# Patient Record
Sex: Male | Born: 1995 | Race: White | Hispanic: No | Marital: Single | Smoking: Never smoker
Health system: Southern US, Community
[De-identification: ages and names within clinical notes are randomized; demographics above are authoritative.]

## PROBLEM LIST (undated history)

## (undated) DIAGNOSIS — Z5189 Encounter for other specified aftercare: Secondary | ICD-10-CM

## (undated) DIAGNOSIS — F329 Major depressive disorder, single episode, unspecified: Secondary | ICD-10-CM

## (undated) DIAGNOSIS — F419 Anxiety disorder, unspecified: Secondary | ICD-10-CM

## (undated) DIAGNOSIS — R011 Cardiac murmur, unspecified: Secondary | ICD-10-CM

## (undated) DIAGNOSIS — J45909 Unspecified asthma, uncomplicated: Secondary | ICD-10-CM

## (undated) DIAGNOSIS — Q200 Common arterial trunk: Secondary | ICD-10-CM

## (undated) DIAGNOSIS — F32A Depression, unspecified: Secondary | ICD-10-CM

## (undated) HISTORY — PX: CARDIAC SURGERY: SHX584

## (undated) HISTORY — DX: Unspecified asthma, uncomplicated: J45.909

## (undated) HISTORY — DX: Encounter for other specified aftercare: Z51.89

## (undated) HISTORY — DX: Cardiac murmur, unspecified: R01.1

## (undated) HISTORY — PX: CARDIAC VALVE REPLACEMENT: SHX585

---
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
## 2015-06-05 ENCOUNTER — Emergency Department
Admission: EM | Admit: 2015-06-05 | Discharge: 2015-06-05 | Disposition: A | Discharge status: Home / Self Care | Payer: BC Managed Care – PPO | Attending: Emergency Medicine | Admitting: Emergency Medicine

## 2015-06-05 ENCOUNTER — Emergency Department: Payer: BC Managed Care – PPO

## 2015-06-05 DIAGNOSIS — J45909 Unspecified asthma, uncomplicated: Secondary | ICD-10-CM | POA: Diagnosis not present

## 2015-06-05 DIAGNOSIS — R079 Chest pain, unspecified: Secondary | ICD-10-CM | POA: Diagnosis not present

## 2015-06-05 LAB — CBC
HCT: 47.8 % (ref 40.0–52.0)
Hemoglobin: 16.3 g/dL (ref 13.0–18.0)
MCH: 28.6 pg (ref 26.0–34.0)
MCHC: 34.1 g/dL (ref 32.0–36.0)
MCV: 83.8 fL (ref 80.0–100.0)
PLATELETS: 300 10*3/uL (ref 150–440)
RBC: 5.7 MIL/uL (ref 4.40–5.90)
RDW: 13.5 % (ref 11.5–14.5)
WBC: 13 10*3/uL — AB (ref 3.8–10.6)

## 2015-06-05 LAB — BASIC METABOLIC PANEL
Anion gap: 11 (ref 5–15)
BUN: 12 mg/dL (ref 6–20)
CALCIUM: 9.6 mg/dL (ref 8.9–10.3)
CO2: 23 mmol/L (ref 22–32)
CREATININE: 0.92 mg/dL (ref 0.61–1.24)
Chloride: 107 mmol/L (ref 101–111)
GFR calc non Af Amer: >60 mL/min (ref >60)
Glucose, Bld: 96 mg/dL (ref 65–99)
Potassium: 3.4 mmol/L — ABNORMAL LOW (ref 3.5–5.1)
SODIUM: 141 mmol/L (ref 135–145)

## 2015-06-05 LAB — TROPONIN I: Troponin I: <0.03 ng/mL (ref <0.031)

## 2015-06-05 NOTE — ED Notes (Signed)
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

## 2015-06-05 NOTE — ED Provider Notes (Signed)
[facility] Emergency Department Provider Note  Time seen: 7:34 PM  I have reviewed the triage vital signs and the nursing notes.   HISTORY  Chief Complaint Chest Pain and Shortness of Breath    HPI Troy Norman is a 20 y.o. 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 According to the patient around 6:30 PM he developed central chest pain which lasted about 10 minutes, denies diaphoresis, but states he was slightly nauseated and short of breath. States it is not uncommon for him to get chest pain approximately once a week or so, but this chest pain was different, more severe and lasted longer than his typical chest pain. Denies any chest pain at this time. Denies nausea or shortness of breath.He describes his chest pain as moderate, sharp in nature located in the center of his chest.     No past medical history on file.  There are no active problems to display for this patient.   No past surgical history on file.  No current outpatient prescriptions on file.  Allergies Review of patient's allergies indicates no known allergies.  No family history on file.  Social History Social History  Substance Use Topics  . Smoking status: Not on file  . Smokeless tobacco: Not on file  . Alcohol Use: Not on file    Review of Systems Constitutional: Negative for fever. Cardiovascular: Positive for chest pain now resolved. Respiratory: Negative for shortness of breath. Gastrointestinal: Negative for abdominal pain Musculoskeletal: Negative for back pain Neurological: Negative for headache 10-point ROS otherwise negative.  ____________________________________________   PHYSICAL EXAM:  VITAL SIGNS: ED Triage Vitals  Enc Vitals Group     BP 06/05/15 1918 159/84 mmHg     Pulse Rate 06/05/15 1918 105      Resp 06/05/15 1918 18     Temp 06/05/15 1918 97.8 F (36.6 C)     Temp Source 06/05/15 1918 Oral     SpO2 06/05/15 1918 97 %     Weight 06/05/15 1918 240 lb (108.863 kg)     Height 06/05/15 1918  (1.651 m)     Head Cir --      Peak Flow --      Pain Score 06/05/15 1919 0     Pain Loc --      Pain Edu? --      Excl. in GC? --     Constitutional: Alert and oriented. Well appearing and in no distress. Eyes: Normal exam ENT   Head: Normocephalic and atraumatic.   Mouth/Throat: Mucous membranes are moist. Cardiovascular: Normal rate, regular rhythm. Respiratory: Normal respiratory effort without tachypnea nor retractions. Breath sounds are clear and equal bilaterally. No wheezes/rales/rhonchi. Gastrointestinal: Soft and nontender. No distention.  Musculoskeletal: Nontender with normal range of motion in all extremities.  Neurologic:  Normal speech and language. No gross focal neurologic deficits Skin:  Skin is warm, dry and intact.  Psychiatric: Mood and affect are normal. Speech and behavior are normal.   ____________________________________________    EKG  EKG reviewed and interpreted by myself shows sinus tachycardia 107 bpm, wide QRS, normal axis, RSR pattern consistent with right bundle-branch block. Nonspecific ST changes. No ST elevations identified.  ____________________________________________     INITIAL IMPRESSION / ASSESSMENT AND PLAN / ED COURSE  Pertinent labs & imaging results that were available during my care  of the patient were reviewed by me and considered in my medical decision making (see chart for details).  Patient presents to the emergency department with central chest pain should patient states started at 6:30 PM tonight. Denies any chest pain at this time. Denies nausea, shortness of breath. States he did feel tingling sensation in his arms and legs with the chest pain and after the chest pain. Denies any currently. Overall the patient  appears very well this time. We'll check labs, and discussed the patient with his cardiologist Dr. Elizebeth Brooking at [facility].  Labs are within normal limits besides a slight leukocytosis per chest x-ray negative. We'll repeat a troponin at 10:30 PM  I discussed the patient with [facility] cardiology, they agree with the plan to repeat a troponin and negative patient will be discharged home and can follow up with them. Currently awaiting repeat troponin result.  Second troponin is negative. We'll discharge the patient home. He remains chest pain-free.  ____________________________________________   FINAL CLINICAL IMPRESSION(S) / ED DIAGNOSES  Chest pain   Minna Antis, MD 06/05/15 [phone]

## 2015-06-05 NOTE — Discharge Instructions (Signed)
You have been seen in the emergency department today for chest pain. Your workup has shown normal results. As we discussed please follow-up with your primary care physician in the next 1-2 days for recheck. Return to the emergency department for any further chest pain, trouble breathing, or any other symptom personally concerning to yourself. ° °Nonspecific Chest Pain °It is often hard to find the cause of chest pain. There is always a chance that your pain could be related to something serious, such as a heart attack or a blood clot in your lungs. Chest pain can also be caused by conditions that are not life-threatening. If you have chest pain, it is very important to follow up with your doctor. ° °HOME CARE °· If you were prescribed an antibiotic medicine, finish it all even if you start to feel better. °· Avoid any activities that cause chest pain. °· Do not use any tobacco products, including cigarettes, chewing tobacco, or electronic cigarettes. If you need help quitting, ask your doctor. °· Do not drink alcohol. °· Take medicines only as told by your doctor. °· Keep all follow-up visits as told by your doctor. This is important. This includes any further testing if your chest pain does not go away. °· Your doctor may tell you to keep your head raised (elevated) while you sleep. °· Make lifestyle changes as told by your doctor. These may include: °¨ Getting regular exercise. Ask your doctor to suggest some activities that are safe for you. °¨ Eating a heart-healthy diet. Your doctor or a diet specialist (dietitian) can help you to learn healthy eating options. °¨ Maintaining a healthy weight. °¨ Managing diabetes, if necessary. °¨ Reducing stress. °GET HELP IF: °· Your chest pain does not go away, even after treatment. °· You have a rash with blisters on your chest. °· You have a fever. °GET HELP RIGHT AWAY IF: °· Your chest pain is worse. °· You have an increasing cough, or you cough up blood. °· You have  severe belly (abdominal) pain. °· You feel extremely weak. °· You pass out (faint). °· You have chills. °· You have sudden, unexplained chest discomfort. °· You have sudden, unexplained discomfort in your arms, back, neck, or jaw. °· You have shortness of breath at any time. °· You suddenly start to sweat, or your skin gets clammy. °· You feel nauseous. °· You vomit. °· You suddenly feel light-headed or dizzy. °· Your heart begins to beat quickly, or it feels like it is skipping beats. °These symptoms may be an emergency. Do not wait to see if the symptoms will go away. Get medical help right away. Call your local emergency services (911 in the [place] Do not drive yourself to the hospital. °  °This information is not intended to replace advice given to you by your health care provider. Make sure you discuss any questions you have with your health care provider. °  °Document Released: 10/18/2007 Document Revised: 05/22/2014 Document Reviewed: 12/05/2013 °Elsevier Interactive Patient Education ©2016 [employer] ° °

## 2015-07-01 DIAGNOSIS — Q200 Common arterial trunk: Secondary | ICD-10-CM | POA: Insufficient documentation

## 2016-06-07 ENCOUNTER — Emergency Department
Admission: EM | Admit: 2016-06-07 | Discharge: 2016-06-07 | Disposition: A | Discharge status: Home / Self Care | Payer: Commercial Managed Care - PPO | Attending: Emergency Medicine | Admitting: Emergency Medicine

## 2016-06-07 DIAGNOSIS — R45851 Suicidal ideations: Secondary | ICD-10-CM | POA: Diagnosis present

## 2016-06-07 DIAGNOSIS — Z79899 Other long term (current) drug therapy: Secondary | ICD-10-CM | POA: Diagnosis not present

## 2016-06-07 DIAGNOSIS — F331 Major depressive disorder, recurrent, moderate: Secondary | ICD-10-CM | POA: Insufficient documentation

## 2016-06-07 HISTORY — DX: Common arterial trunk: Q20.0

## 2016-06-07 HISTORY — DX: Major depressive disorder, single episode, unspecified: F32.9

## 2016-06-07 HISTORY — DX: Anxiety disorder, unspecified: F41.9

## 2016-06-07 HISTORY — DX: Depression, unspecified: F32.A

## 2016-06-07 LAB — COMPREHENSIVE METABOLIC PANEL
ALBUMIN: 4.7 g/dL (ref 3.5–5.0)
ALK PHOS: 51 U/L (ref 38–126)
ALT: 41 U/L (ref 17–63)
AST: 24 U/L (ref 15–41)
Anion gap: 9 (ref 5–15)
BUN: 19 mg/dL (ref 6–20)
CHLORIDE: 105 mmol/L (ref 101–111)
CO2: 24 mmol/L (ref 22–32)
CREATININE: 1.04 mg/dL (ref 0.61–1.24)
Calcium: 9.7 mg/dL (ref 8.9–10.3)
GFR calc Af Amer: >60 mL/min (ref >60)
GFR calc non Af Amer: >60 mL/min (ref >60)
GLUCOSE: 94 mg/dL (ref 65–99)
Potassium: 3.9 mmol/L (ref 3.5–5.1)
SODIUM: 138 mmol/L (ref 135–145)
Total Bilirubin: 0.8 mg/dL (ref 0.3–1.2)
Total Protein: 8.3 g/dL — ABNORMAL HIGH (ref 6.5–8.1)

## 2016-06-07 LAB — CBC
HEMATOCRIT: 45.4 % (ref 40.0–52.0)
HEMOGLOBIN: 15.7 g/dL (ref 13.0–18.0)
MCH: 28.3 pg (ref 26.0–34.0)
MCHC: 34.5 g/dL (ref 32.0–36.0)
MCV: 82 fL (ref 80.0–100.0)
Platelets: 364 10*3/uL (ref 150–440)
RBC: 5.54 MIL/uL (ref 4.40–5.90)
RDW: 14 % (ref 11.5–14.5)
WBC: 15 10*3/uL — ABNORMAL HIGH (ref 3.8–10.6)

## 2016-06-07 LAB — SALICYLATE LEVEL

## 2016-06-07 LAB — ACETAMINOPHEN LEVEL: Acetaminophen (Tylenol), Serum: <10 ug/mL — ABNORMAL LOW (ref 10–30)

## 2016-06-07 LAB — ETHANOL: Alcohol, Ethyl (B): <5 mg/dL (ref <5)

## 2016-06-07 MED ORDER — HYDROXYZINE PAMOATE 25 MG PO CAPS: 25.0000 mg | ORAL_CAPSULE | Freq: Two times a day (BID) | ORAL | 0 refills | Status: DC | PRN

## 2016-06-07 MED ORDER — BUPROPION HCL ER (SR) 150 MG PO TB12: 150.0000 mg | ORAL_TABLET | ORAL | 1 refills | Status: DC

## 2016-06-07 NOTE — ED Notes (Signed)
Pt. Going home with parent. 

## 2016-06-07 NOTE — ED Provider Notes (Signed)
[facility] Emergency Department Provider Note  ____________________________________________   First MD Initiated Contact with Patient 06/07/16 1949     (approximate)  I have reviewed the triage vital signs and the nursing notes.   HISTORY  Chief Complaint Suicidal    HPI Troy Norman is a 21 y.o. 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  He presents for evaluation of gradually worsening depression and anxiety over months to years which escalated to the point that he very briefly considered "stabbing myself with a knife" today.  He states that the thought was brief and he immediately realized it was wrong, threw the knife on the ground, and sought help from a friend (his ex-girlfriend).  She contacted his mother who insisted he come to the Emergency Department.  He is very clear that he has no ongoing suicidal ideation but he does feel like he needs help coping with "things".  He denies ETOH, drugs, tobacco.  Denies any active medical complaints as listed below.  Has been having trouble sleeping.  Denies hallucinations.  Denies thoughts of hurting others.  Describes symptoms as severe, nothing makes better nor worse.   Past Medical History:  Diagnosis Date  . Anxiety   . Depression   . Truncus arteriosus     There are no active problems to display for this patient.   History reviewed. No pertinent surgical history.  Prior to Admission medications   Medication Sig Start Date End Date Taking? Authorizing Provider  buPROPion (WELLBUTRIN SR) 150 MG 12 hr tablet Take 1 tablet (150 mg total) by mouth every morning. 06/07/16   Loleta Rose, MD  cetirizine (ZYRTEC) 10 MG tablet Take 10 mg by mouth daily as needed for allergies or rhinitis.    Historical Provider, MD  hydrOXYzine (VISTARIL) 25 MG capsule Take 1 capsule (25 mg total) by  mouth 3 times/day as needed-between meals & bedtime (anxiety, insomnia). 06/07/16   Loleta Rose, MD    Allergies Patient has no known allergies.  History reviewed. No pertinent family history.  Social History Social History  Substance Use Topics  . Smoking status: Never Smoker  . Smokeless tobacco: Never Used  . Alcohol use No    Review of Systems Constitutional: No fever/chills Eyes: No visual changes. ENT: No sore throat. Cardiovascular: Denies chest pain. Respiratory: Denies shortness of breath. Gastrointestinal: No abdominal pain.  No nausea, no vomiting.  No diarrhea.  No constipation. Genitourinary: Negative for dysuria. Musculoskeletal: Negative for back pain. Skin: Negative for rash. Neurological: Negative for headaches, focal weakness or numbness. Psychiatric:Worsening depression and anxiety over extended period of time with brief thought of harming himself today 10-point ROS otherwise negative.  ____________________________________________   PHYSICAL EXAM:  VITAL SIGNS: ED Triage Vitals  Enc Vitals Group     BP 06/07/16 1800 (!) 165/82     Pulse Rate 06/07/16 1800 97     Resp 06/07/16 1800 16     Temp 06/07/16 1800 98.7 F (37.1 C)     Temp Source 06/07/16 1800 Oral     SpO2 06/07/16 1800 98 %     Weight 06/07/16 1801 220 lb (99.8 kg)     Height 06/07/16 1801 5\' 5"  (1.651 m)     Head Circumference --      Peak Flow --      Pain Score --      Pain Loc --  Pain Edu? --      Excl. in GC? --     Constitutional: Alert and oriented. Well appearing and in no acute distress. Eyes: Conjunctivae are normal. PERRL. EOMI. Head: Atraumatic. Nose: No congestion/rhinnorhea. Mouth/Throat: Mucous membranes are moist.  Oropharynx non-erythematous. Neck: No stridor.  No meningeal signs.   Cardiovascular: Normal rate, regular rhythm. Good peripheral circulation. Grossly normal heart sounds. Respiratory: Normal respiratory effort.  No retractions. Lungs  CTAB. Gastrointestinal: Soft and nontender. No distention.  Musculoskeletal: No lower extremity tenderness nor edema. No gross deformities of extremities. Neurologic:  Normal speech and language. No gross focal neurologic deficits are appreciated.  Skin:  Skin is warm, dry and intact. No rash noted.   ____________________________________________   LABS (all labs ordered are listed, but only abnormal results are displayed)  Labs Reviewed  COMPREHENSIVE METABOLIC PANEL - Abnormal; Notable for the following:       Result Value   Total Protein 8.3 (*)    All other components within normal limits  ACETAMINOPHEN LEVEL - Abnormal; Notable for the following:    Acetaminophen (Tylenol), Serum <10 (*)    All other components within normal limits  CBC - Abnormal; Notable for the following:    WBC 15.0 (*)    All other components within normal limits  ETHANOL  SALICYLATE LEVEL  URINE DRUG SCREEN, QUALITATIVE ([facility] ONLY)   ____________________________________________  EKG  No EKG ordered ____________________________________________  RADIOLOGY   No results found.  ____________________________________________   PROCEDURES  Procedure(s) performed:   Procedures   Critical Care performed: No ____________________________________________   INITIAL IMPRESSION / ASSESSMENT AND PLAN / ED COURSE  Pertinent labs & imaging results that were available during my care of the patient were reviewed by me and considered in my medical decision making (see chart for details).  I did not feel that the patient represented an immediate danger to himself, and he actually showed good insight and judgment that he needed to seek assistance.  He does not meet IVC nor inpatient criteria.  I will keep him voluntary and consult telepsych for recommendations.  He agrees with this plan.   (Note that documentation was delayed due to multiple ED patients requiring immediate care.)   Discussed by phone  with telepsych and reviewed written report.  She agrees no indication for inpatient treatment.  Wrote Rxs for Wellbutrin and Vistaril as per her recommendations.  TTS provided outpatient resources.  Gave stric return precautions.      ____________________________________________  FINAL CLINICAL IMPRESSION(S) / ED DIAGNOSES  Final diagnoses:  Moderate episode of recurrent major depressive disorder (HCC)     MEDICATIONS GIVEN DURING THIS VISIT:  Medications - No data to display   NEW OUTPATIENT MEDICATIONS STARTED DURING THIS VISIT:  Discharge Medication List as of 06/07/2016 11:33 PM    START taking these medications   Details  buPROPion (WELLBUTRIN SR) 150 MG 12 hr tablet Take 1 tablet (150 mg total) by mouth every morning., Starting Wed 06/07/2016, Print    hydrOXYzine (VISTARIL) 25 MG capsule Take 1 capsule (25 mg total) by mouth 3 times/day as needed-between meals & bedtime (anxiety, insomnia)., Starting Wed 06/07/2016, Print        Discharge Medication List as of 06/07/2016 11:33 PM      Discharge Medication List as of 06/07/2016 11:33 PM       Note:  This document was prepared using Dragon voice recognition software and may include unintentional dictation errors.    Loleta Roseory Pamlea Finder, MD 06/08/16  0156  

## 2016-06-07 NOTE — BH Assessment (Signed)
Per ER MD (Dr. York CeriseForbach) the patient doesn't need to be seen by Behavioral Medicine. [facility].   Per request of ER MD, writer provided the pt. with information and instructions on how to access Outpatient Mental Health  ([facility] and [facility]), as well as the 24/7 Suicide Psychologist, sport and exerciserevention Lifeline.

## 2016-06-07 NOTE — ED Triage Notes (Signed)
Pt reports hx of anxiety and depression. Girlfriend broke up with him today. Pt reports "the thought entered my mind what if I stabbed myself" in reference to the iphone with broken glass he was holding in his hand. Denies attempt or plan at this time. Pt alert and oriented X4, active, cooperative, pt in NAD. RR even and unlabored, color WNL.  \

## 2016-06-07 NOTE — ED Notes (Signed)
SOC machine set up in Patient room.  Pt. Ready.

## 2016-06-07 NOTE — ED Notes (Signed)
Pt. Finished SOC, machine taken out of room.

## 2016-06-07 NOTE — Discharge Instructions (Signed)
You have been seen in the Emergency Department (ED) today for a psychiatric complaint.  You have been evaluated by psychiatry and we believe you are safe to be discharged from the hospital.    Please return to the ED immediately if you have any thoughts of hurting yourself or anyone else, so that we may help you.  Please avoid alcohol and drug use.  Follow up with your doctor and/or therapist as soon as possible regarding today's ED visit.   Please follow up any other recommendations and clinic appointments provided by the psychiatry team that saw you in the Emergency Department.

## 2016-06-07 NOTE — ED Notes (Signed)
Spoke to parents in waiting room.  Informed patient parents that [facility].  Parents gave phone number that they could be called if [phone]

## 2016-06-07 NOTE — ED Notes (Signed)

## 2016-10-25 ENCOUNTER — Encounter: Payer: Self-pay | Admitting: *Deleted

## 2016-10-25 ENCOUNTER — Emergency Department
Admission: EM | Admit: 2016-10-25 | Discharge: 2016-10-26 | Disposition: A | Discharge status: Home / Self Care | Payer: Commercial Managed Care - PPO | Attending: Student in an Organized Health Care Education/Training Program | Admitting: Student in an Organized Health Care Education/Training Program

## 2016-10-25 DIAGNOSIS — R519 Headache, unspecified: Secondary | ICD-10-CM

## 2016-10-25 DIAGNOSIS — H538 Other visual disturbances: Secondary | ICD-10-CM | POA: Diagnosis not present

## 2016-10-25 DIAGNOSIS — R51 Headache: Secondary | ICD-10-CM | POA: Diagnosis not present

## 2016-10-25 MED ORDER — ACETAMINOPHEN 325 MG PO TABS: 650.0000 mg | ORAL_TABLET | Freq: Once | ORAL | Status: DC

## 2016-10-25 MED ORDER — PROCHLORPERAZINE MALEATE 10 MG PO TABS
10.0000 mg | ORAL_TABLET | Freq: Once | ORAL | Status: DC
  Filled 2016-10-25: qty 1

## 2016-10-25 NOTE — ED Triage Notes (Signed)
Pt reports while at work today, he bent over and had blurred vision, subsided.  Sx reoccurred with a headache.  No n/v/d.  No blurred vision now.  Pt alert.  Speech clear.

## 2016-10-25 NOTE — ED Provider Notes (Signed)
[facility] Emergency Department Provider Note    First MD Initiated Contact with Patient 10/25/16 2333     (approximate)  I have reviewed the triage vital signs and the nursing notes.   HISTORY  Chief Complaint Blurred Vision and Headache    HPI Troy Norman is a 21 y.o. male who presents with 2 brief episodes of left eye blurry vision that occurred while at work today. 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 States that he noted today when he was lifting up a heavy soup bowl at work and bending over he stood up and then started having blurred vision in the left eye. Did not have any pain. Symptoms subsided after a few moments. He then started having increased stress at work and had another episode that lasted alert 30 minutes or so. Did not completely lose vision states it is just blurry. This was followed by a mild headache. Not the worse headache of his life. Says it is very mild in fact in his primary concern was stress and the blurry vision. Denies any associated numbness or tingling. No chest pain or shortness of breath. No nausea or vomiting. Denies any neck pain.   Past Medical History:  Diagnosis Date  . Anxiety   . Depression   . Truncus arteriosus    No family history on file. No past surgical history on file. There are no active problems to display for this patient.     Prior to Admission medications   Medication Sig Start Date End Date Taking? Authorizing Provider  buPROPion (WELLBUTRIN SR) 150 MG 12 hr tablet Take 1 tablet (150 mg total) by mouth every morning. 06/07/16   Loleta Rose, MD  cetirizine (ZYRTEC) 10 MG tablet Take 10 mg by mouth daily as needed for allergies or rhinitis.    [provider]  hydrOXYzine (VISTARIL) 25 MG capsule Take 1 capsule (25 mg total) by mouth 3 times/day as needed-between meals & bedtime (anxiety, insomnia). 06/07/16    Loleta Rose, MD    Allergies Patient has no known allergies.    Social History Social History  Substance Use Topics  . Smoking status: Never Smoker  . Smokeless tobacco: Never Used  . Alcohol use No    Review of Systems Patient denies headaches, rhinorrhea, blurry vision, numbness, shortness of breath, chest pain, edema, cough, abdominal pain, nausea, vomiting, diarrhea, dysuria, fevers, rashes or hallucinations unless otherwise stated above in HPI. ____________________________________________   PHYSICAL EXAM:  VITAL SIGNS: Vitals:   10/25/16 2149  BP: (!) 154/81  Pulse: 100  Resp: 18  Temp: 98.6 F (37 C)    Constitutional: Alert and oriented. Well appearing and in no acute distress. Eyes: Conjunctivae are normal.  Head: Atraumatic. Nose: No congestion/rhinnorhea. Mouth/Throat: Mucous membranes are moist.   Neck: No stridor. Painless ROM.  Cardiovascular: Normal rate, regular rhythm. Grossly normal heart sounds.  Good peripheral circulation. Respiratory: Normal respiratory effort.  No retractions. Lungs CTAB. Gastrointestinal: Soft and nontender. No distention. No abdominal bruits. No CVA tenderness. Genitourinary:  Musculoskeletal: No lower extremity tenderness nor edema.  No joint effusions. Neurologic:  CN- intact.  No facial droop, Normal FNF.  Normal heel to shin.  Sensation intact bilaterally. Normal speech and language. No gross focal neurologic deficits are appreciated. No gait instability.  Skin:  Skin is warm, dry and intact. No rash noted. Psychiatric: Mood and affect are normal. Speech and behavior  are normal.  ____________________________________________   LABS (all labs ordered are listed, but only abnormal results are displayed)  No results found for this or any previous visit (from the past 24 hour(s)). ____________________________________________  EKG My review and personal interpretation at Time: 23;56  Indication: blurry vision  Rate:  85  Rhythm: sinus Axis: normal Other: RBBB, no STEMI, unchanged from previous EKG 1/17 ____________________________________________  RADIOLOGY   ____________________________________________   PROCEDURES  Procedure(s) performed:  Procedures    Critical Care performed: no ____________________________________________   INITIAL IMPRESSION / ASSESSMENT AND PLAN / ED COURSE  Pertinent labs & imaging results that were available during my care of the patient were reviewed by me and considered in my medical decision making (see chart for details).  DDX: migraine, tension, dehydration, dysrhythmia, retinal detachment, stress, unlikely ICH or dissiection  Troy Norman is a 21 y.o. who presents to the ED with above complaints.  Patient is AFVSS in ED. Exam as above. Given current presentation have considered the above differential.  Patient is very well appearing and he has no focal neurodeficits. Blurry vision symptoms resolved. Denies any chest pain. Does not have any bruits on exam. This is not clinically consistent with dissection. EKG is unchanged from previous and the patient is 21 years old therefore do not feel this is related to ACS. Possible component of dehydration given increased workload the patient is not tach tachycardic and is tolerating oral hydration. Possible component of GERD associated headache but headache is very mild. Patient does not have any meningismus. Do not feel that CT imaging clinically indicated.   Clinical Course as of Oct 26 509  Thu Oct 26, 2016  0018 Patient is currently rating his headache as a 1. Bedside ultrasound shows normal optic nerve with no edema and no retinal detachment. His visual acuity is normal. The patient has no focal neuro deficits and has no chest pain at this point.  As he is asymptomatic, well-appearing and denies any other complaints that he feels patient is stable for discharge to follow up with his PCP.  [PR]    Clinical Course  User Index [PR] Troy Norman, Victorya Hillman, MD     ____________________________________________   FINAL CLINICAL IMPRESSION(S) / ED DIAGNOSES  Final diagnoses:  Blurry vision, left eye  Acute nonintractable headache, unspecified headache type      NEW MEDICATIONS STARTED DURING THIS VISIT:  New Prescriptions   No medications on file     Note:  This document was prepared using Dragon voice recognition software and may include unintentional dictation errors.    Troy Norman, Troy Massi, MD 10/26/16 [phone]

## 2016-10-26 NOTE — Discharge Instructions (Signed)

## 2019-02-23 ENCOUNTER — Emergency Department
Admission: EM | Admit: 2019-02-23 | Discharge: 2019-02-23 | Disposition: A | Discharge status: Home / Self Care | Payer: BC Managed Care – PPO | Attending: Emergency Medicine | Admitting: Emergency Medicine

## 2019-02-23 ENCOUNTER — Emergency Department: Payer: BC Managed Care – PPO

## 2019-02-23 ENCOUNTER — Other Ambulatory Visit: Payer: Self-pay

## 2019-02-23 ENCOUNTER — Encounter: Payer: Self-pay | Admitting: Emergency Medicine

## 2019-02-23 DIAGNOSIS — R079 Chest pain, unspecified: Secondary | ICD-10-CM

## 2019-02-23 DIAGNOSIS — Z87898 Personal history of other specified conditions: Secondary | ICD-10-CM | POA: Insufficient documentation

## 2019-02-23 DIAGNOSIS — R0789 Other chest pain: Secondary | ICD-10-CM | POA: Insufficient documentation

## 2019-02-23 DIAGNOSIS — Z9889 Other specified postprocedural states: Secondary | ICD-10-CM

## 2019-02-23 LAB — BASIC METABOLIC PANEL
Anion gap: 14 (ref 5–15)
BUN: 17 mg/dL (ref 6–20)
CO2: 23 mmol/L (ref 22–32)
Calcium: 10 mg/dL (ref 8.9–10.3)
Chloride: 102 mmol/L (ref 98–111)
Creatinine, Ser: 0.91 mg/dL (ref 0.61–1.24)
GFR calc Af Amer: >60 mL/min (ref >60)
GFR calc non Af Amer: >60 mL/min (ref >60)
Glucose, Bld: 100 mg/dL — ABNORMAL HIGH (ref 70–99)
Potassium: 3.7 mmol/L (ref 3.5–5.1)
Sodium: 139 mmol/L (ref 135–145)

## 2019-02-23 LAB — CBC
HCT: 47 % (ref 39.0–52.0)
Hemoglobin: 16.5 g/dL (ref 13.0–17.0)
MCH: 28.8 pg (ref 26.0–34.0)
MCHC: 35.1 g/dL (ref 30.0–36.0)
MCV: 82.2 fL (ref 80.0–100.0)
Platelets: 339 10*3/uL (ref 150–400)
RBC: 5.72 MIL/uL (ref 4.22–5.81)
RDW: 12.4 % (ref 11.5–15.5)
WBC: 9.9 10*3/uL (ref 4.0–10.5)
nRBC: 0 % (ref 0.0–0.2)

## 2019-02-23 LAB — TROPONIN I (HIGH SENSITIVITY)
Troponin I (High Sensitivity): 5 ng/L (ref <18)
Troponin I (High Sensitivity): 4 ng/L (ref <18)

## 2019-02-23 NOTE — ED Provider Notes (Signed)
[facility] Emergency Department Provider Note   ____________________________________________   First MD Initiated Contact with Patient 02/23/19 1552     (approximate)  I have reviewed the triage vital signs and the nursing notes.   HISTORY  Chief Complaint Chest Pain    HPI Troy Norman is a 23 y.o. male with history of truncus arteriosus status post repair and anxiety who presents to the ED complaining of chest pain.  Patient reports that he initially developed a squeezing pain in the center of his chest yesterday while he was at work.  He was working in [employer] over a stove when he suddenly felt nauseous and dry heaved.  Pain seemed to improve afterwards, but recurred this morning.  He states he will occasionally deal with pain in the center of his chest which he attributes to anxiety, but this was more severe than usual.  He denies any associated fevers, chills, cough, or shortness of breath.  He has not noticed any pain or swelling in his legs.        Past Medical History:  Diagnosis Date  . Anxiety   . Depression   . Truncus arteriosus     There are no active problems to display for this patient.   Past Surgical History:  Procedure Laterality Date  . CARDIAC SURGERY     x3 last at age 60    Prior to Admission medications   Not on File    Allergies Patient has no known allergies.  No family history on file.  Social History Social History   Tobacco Use  . Smoking status: Never Smoker  . Smokeless tobacco: Never Used  Substance Use Topics  . Alcohol use: No  . Drug use: No    Review of Systems  Constitutional: No fever/chills Eyes: No visual changes. ENT: No sore throat. Cardiovascular: Positive for chest pain. Respiratory: Denies shortness of breath. Gastrointestinal: No abdominal pain.  No nausea, no vomiting.  No diarrhea.  No constipation. Genitourinary: Negative for dysuria. Musculoskeletal: Negative  for back pain. Skin: Negative for rash. Neurological: Negative for headaches, focal weakness or numbness.  ____________________________________________   PHYSICAL EXAM:  VITAL SIGNS: ED Triage Vitals  Enc Vitals Group     BP 02/23/19 1323 (!) 147/75     Pulse Rate 02/23/19 1323 (!) 104     Resp 02/23/19 1323 18     Temp 02/23/19 1323 98.4 F (36.9 C)     Temp Source 02/23/19 1323 Oral     SpO2 02/23/19 1323 100 %     Weight 02/23/19 1324 210 lb (95.3 kg)     Height 02/23/19 1324 5\' 6"  (1.676 m)     Head Circumference --      Peak Flow --      Pain Score 02/23/19 1323 2     Pain Loc --      Pain Edu? --      Excl. in GC? --     Constitutional: Alert and oriented. Eyes: Conjunctivae are normal. Head: Atraumatic. Nose: No congestion/rhinnorhea. Mouth/Throat: Mucous membranes are moist. Neck: Normal ROM Cardiovascular: Normal rate, regular rhythm. Grossly normal heart sounds. Respiratory: Normal respiratory effort.  No retractions. Lungs CTAB. Gastrointestinal: Soft and nontender. No distention. Genitourinary: deferred Musculoskeletal: No lower extremity tenderness nor edema. Neurologic:  Normal speech and language. No gross focal neurologic deficits are appreciated. Skin:  Skin is warm, dry and intact. No rash noted. Psychiatric: Mood and affect are normal. Speech and behavior  are normal.  ____________________________________________   LABS (all labs ordered are listed, but only abnormal results are displayed)  Labs Reviewed  BASIC METABOLIC PANEL - Abnormal; Notable for the following components:      Result Value   Glucose, Bld 100 (*)    All other components within normal limits  CBC  TROPONIN I (HIGH SENSITIVITY)  TROPONIN I (HIGH SENSITIVITY)   ____________________________________________  EKG  ED ECG REPORT I, Blake Divine, the attending physician, personally viewed and interpreted this ECG.   Date: 02/23/2019  EKG Time: 13:19  Rate: 99  Rhythm:  normal sinus rhythm  Axis: Normal  Intervals:right bundle branch block  ST&T Change: None, RBBB unchanged    PROCEDURES  Procedure(s) performed (including Critical Care):  Procedures   ____________________________________________   INITIAL IMPRESSION / ASSESSMENT AND PLAN / ED COURSE       23 year old male with history of truncus arteriosus status post repair presents to the ED complaining of intermittent episodes of chest pain since last night.  These are atypical, not associated with exertion.  He has no risk factors for ACS and EKG is unchanged from prior, shows old right bundle branch block.  2 sets of troponin are negative, doubt ACS.  Chest x-ray negative for acute process and remainder of labs are unremarkable.  At this point, very low suspicion that his symptoms are cardiac in etiology, anxiety appears more likely.  Encouraged patient to follow-up with his cardiologist and return to the ED for new or worsening symptoms, patient agrees with plan.      ____________________________________________   FINAL CLINICAL IMPRESSION(S) / ED DIAGNOSES  Final diagnoses:  Chest pain, unspecified type  S/P repair of truncus arteriosus     ED Discharge Orders    None       Note:  This document was prepared using Dragon voice recognition software and may include unintentional dictation errors.   Blake Divine, MD 02/23/19 [phone]

## 2019-02-23 NOTE — ED Triage Notes (Signed)
Pt arrived via POV with reports of chest pain that started last night around 8pm, pt states he was at work at [employer] and developed a pain in the epigastric region, pt states since then the pain has moved up in his chest more. Pt states the pain comes and goes. Pt states when he gets chest pain he feels like his heart is racing. Non-radiating at this time.  Pt has hx of congenital heart defect, has had 3 open heart surgeries with the last one about 7 years ago.  Pt states he has not followed up with cardiology yet.  Pt also states that he has had increased anxiety as well.

## 2019-02-23 NOTE — ED Notes (Addendum)
Patient states cp since yesterday history of cardiac issues in past, has had open heart surgery. Patient placed on monitor. Md T bedside.

## 2019-02-23 NOTE — ED Triage Notes (Signed)
First Nurse Note:  C/O chest pain x 1 day.  States has history of anxiety.  AAOx3.  Skin warm and dry. NAD

## 2019-03-03 ENCOUNTER — Encounter (HOSPITAL_COMMUNITY): Payer: Self-pay | Admitting: Licensed Clinical Social Worker

## 2019-03-03 ENCOUNTER — Ambulatory Visit (INDEPENDENT_AMBULATORY_CARE_PROVIDER_SITE_OTHER): Payer: BC Managed Care – PPO | Admitting: Licensed Clinical Social Worker

## 2019-03-03 ENCOUNTER — Other Ambulatory Visit: Payer: Self-pay

## 2019-03-03 DIAGNOSIS — F411 Generalized anxiety disorder: Secondary | ICD-10-CM | POA: Diagnosis not present

## 2019-03-03 DIAGNOSIS — F410 Panic disorder [episodic paroxysmal anxiety] without agoraphobia: Secondary | ICD-10-CM | POA: Diagnosis not present

## 2019-03-03 NOTE — Progress Notes (Signed)
Virtual Visit via Video Note  I connected with Troy Norman on 03/03/19 at  8:00 AM EDT by a video enabled telemedicine application and verified that I am speaking with the correct person using two identifiers.    I discussed the limitations of evaluation and management by telemedicine and the availability of in person appointments. The patient expressed understanding and agreed to proceed.    I discussed the assessment and treatment plan with the patient. The patient was provided an opportunity to ask questions and all were answered. The patient agreed with the plan and demonstrated an understanding of the instructions.   The patient was advised to call back or seek an in-person evaluation if the symptoms worsen or if the condition fails to improve as anticipated.  I provided 60 minutes of non-face-to-face time during this encounter.   Troy MelterJessica R Bralynn Donado, LCSW   Comprehensive Clinical Assessment (CCA) Note  03/03/2019 Troy Norman [id number]  Visit Diagnosis:      ICD-10-CM   1. GAD (generalized anxiety disorder)  F41.1   2. Panic attacks  F41.0       CCA Part One  Part One has been completed on paper by the patient.  (See scanned document in Chart Review)  CCA Part Two A  Intake/Chief Complaint:  CCA Intake With Chief Complaint CCA Part Two Date: 03/03/19 CCA Part Two Time: 0810 Chief Complaint/Presenting Problem: a few years ago, series of events happened in 1 month. Since then, on and off anxious. Would go a few weeks with no problems, then a month of anxiety. Now, past 3 months straight, gets worked up, starting to effect work, school, relationships with friends. Patients Currently Reported Symptoms/Problems: Anxiety sxs: gets jittery, causes chest pains, taking a toll on heart- has had 3 open heart surgeries, goes into panic 1-2 times per week, heart beats fast and hard, high blood pressure. Sometimes no trigger, work. Collateral Involvement: has 1 friend,  had a therapist up until COVID. Family is not really close- they do not talk about this kind of stuff. Individual's Strengths: [place] at [employer], keeps everyone's spirits up at work, tries to enjoy things and get others to feel good. Individual's Preferences: writes music for concert band, listens to podcasts, likes to learn something new Individual's Abilities: very smart, musical, likes to learn, cooking for other people Type of Services Patient Feels Are Needed: medication management, Initial Clinical Notes/Concerns: wakes up with panic/shaking daily  Mental Health Symptoms Depression:  Depression: Fatigue  Mania:  Mania: Racing thoughts, Increased Energy  Anxiety:   Anxiety: Difficulty concentrating, Irritability, Restlessness, Worrying, Tension, Sleep, Fatigue(has panic attacks 1-2 times per week)  Psychosis:  Psychosis: N/A  Trauma:  Trauma: Irritability/anger, Detachment from others, Hypervigilance, Emotional numbing, Avoids reminders of event(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, had 3 open heart surgeries)  Obsessions:  Obsessions: N/A  Compulsions:  Compulsions: N/A  Inattention:  Inattention: N/A  Hyperactivity/Impulsivity:  Hyperactivity/Impulsivity: N/A  Oppositional/Defiant Behaviors:  Oppositional/Defiant Behaviors: N/A  Borderline Personality:  Emotional Irregularity: N/A  Other Mood/Personality Symptoms:      Mental Status Exam Appearance and self-care  Stature:  Stature: Average  Weight:  Weight: Average weight  Clothing:  Clothing: Neat/clean  Grooming:  Grooming: Normal  Cosmetic use:  Cosmetic Use: None  Posture/gait:  Posture/Gait: Normal  Motor activity:  Motor Activity: Not Remarkable  Sensorium  Attention:  Attention: Normal  Concentration:  Concentration: Anxiety interferes  Orientation:  Orientation: X5  Recall/memory:  Recall/Memory: Normal  Affect and Mood  Affect:  Affect: Appropriate  Mood:  Mood: Euthymic, Anxious  Relating  Eye  contact:  Eye Contact: Normal  Facial expression:  Facial Expression: Responsive  Attitude toward examiner:  Attitude Toward Examiner: Cooperative  Thought and Language  Speech flow: Speech Flow: Normal  Thought content:  Thought Content: Appropriate to mood and circumstances  Preoccupation:   NA  Hallucinations:   NA  Organization:   logical  Company secretary of Knowledge:  Fund of Knowledge: Average  Intelligence:  Intelligence: Above Average  Abstraction:  Abstraction: Normal  Judgement:  Judgement: Normal  Reality Testing:  Reality Testing: Realistic  Insight:  Insight: Good  Decision Making:  Decision Making: Normal  Social Functioning  Social Maturity:  Social Maturity: Responsible  Social Judgement:  Social Judgement: Normal  Stress  Stressors:  Stressors: Transitions, Money  Coping Ability:  Coping Ability: Building surveyor Deficits:   NA  Supports:   family, friends, work, school   Family and Psychosocial History: Family history Marital status: Single Are you sexually active?: Yes What is your sexual orientation?: heterosexual Has your sexual activity been affected by drugs, alcohol, medication, or emotional stress?: no Does patient have children?: No  Childhood History:  Childhood History By whom was/is the patient raised?: Both parents Additional childhood history information: born in [place], moved to [place] at 6-7. played soccer, gymnastics. Artery closed and had a stint in 5th grade. Started band in middle school. Had 3 open heart surgeries. Description of patient's relationship with caregiver when they were a child: pretty good relationship with parents Patient's description of current relationship with people who raised him/her: still good, lives with parents until he gets out of college How were you disciplined when you got in trouble as a child/adolescent?: yelling, time out Does patient have siblings?: Yes Number of Siblings: 1 Description of patient's  current relationship with siblings: younger sister- gets along alright Did patient suffer any verbal/emotional/physical/sexual abuse as a child?: No Did patient suffer from severe childhood neglect?: No Has patient ever been sexually abused/assaulted/raped as an adolescent or adult?: No Was the patient ever a victim of a crime or a disaster?: No Witnessed domestic violence?: No Has patient been effected by domestic violence as an adult?: No  CCA Part Two B  Employment/Work Situation: Employment / Work Psychologist, occupational Employment situation: Employed Where is patient currently employed?: [employer] in [place]- works about 30 hours over the weekend How long has patient been employed?: 2.5 years Patient's job has been impacted by current illness: Yes Describe how patient's job has been impacted: panic attacks started when started wearing a mask What is the longest time patient has a held a job?: current job Where was the patient employed at that time?: [employer] Did You Receive Any Psychiatric Treatment/Services While in the 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 Are These Weapons Safely Secured?: Yes  Education: Education School Currently Attending: [employer] Last Grade Completed: 12 Name of High School: [employer] in [place] Did You Graduate From [employer]?: Yes Did You Attend College?: Yes What Type of College Degree Do you Have?: almost done with BA in History Did You Attend Graduate School?: No What Was Your Major?: History Did You Have Any Special Interests In School?: wants to be a history teacher Did You Have An Individualized Education Program (IIEP): No Did You Have Any Difficulty At School?: No  Religion: Religion/Spirituality  Are You A Religious Person?: No How Might This Affect Treatment?: it won't  Leisure/Recreation: Leisure / Recreation Leisure and Hobbies: write music, video  games, hangs out with friends  Exercise/Diet: Exercise/Diet Do You Exercise?: No Have You Gained or Lost A Significant Amount of Weight in the Past Six Months?: No Do You Follow a Special Diet?: No Do You Have Any Trouble Sleeping?: Yes Explanation of Sleeping Difficulties: some trouble getting to sleep, has periods where he will only sleep 3-4 hours per night, wakes hourly.  CCA Part Two C  Alcohol/Drug Use: Alcohol / Drug Use Pain Medications: see MAR Prescriptions: see MAR Over the Counter: see MAR History of alcohol / drug use?: No history of alcohol / drug abuse Longest period of sobriety (when/how long): drinks occasionally                      CCA Part Three  ASAM's:  Six Dimensions of Multidimensional Assessment  Dimension 1:  Acute Intoxication and/or Withdrawal Potential:     Dimension 2:  Biomedical Conditions and Complications:     Dimension 3:  Emotional, Behavioral, or Cognitive Conditions and Complications:     Dimension 4:  Readiness to Change:     Dimension 5:  Relapse, Continued use, or Continued Problem Potential:     Dimension 6:  Recovery/Living Environment:      Substance use Disorder (SUD)    Social Function:  Social Functioning Social Maturity: Responsible Social Judgement: Normal  Stress:  Stress Stressors: Transitions, Money Coping Ability: Overwhelmed Patient Takes Medications The Way The Doctor Instructed?: No Priority Risk: Low Acuity  Risk Assessment- Self-Harm Potential: Risk Assessment For Self-Harm Potential Thoughts of Self-Harm: No current thoughts Method: No plan Additional Comments for Self-Harm Potential: one hospitalization a few years ago due to SI.  Risk Assessment -Dangerous to Others Potential: Risk Assessment For Dangerous to Others Potential Method: No Plan Availability of Means: No access or NA Intent: Vague intent or NA Notification Required: No need or identified person  DSM5 Diagnoses: There are no  active problems to display for this patient.   Patient Centered Plan: Patient is on the following Treatment Plan(s):  Anxiety  Recommendations for Services/Supports/Treatments:  OPT, Medication Management  Treatment Plan Summary: OP Treatment Plan Summary: "reducing my anxiety"  Referrals to Alternative Service(s): Referred to Alternative Service(s):   Place:   Date:   Time:    Referred to Alternative Service(s):   Place:   Date:   Time:    Referred to Alternative Service(s):   Place:   Date:   Time:    Referred to Alternative Service(s):   Place:   Date:   Time:     Mindi Curling, LCSW

## 2019-03-20 ENCOUNTER — Encounter (HOSPITAL_COMMUNITY): Payer: Self-pay | Admitting: Licensed Clinical Social Worker

## 2019-03-20 ENCOUNTER — Ambulatory Visit (INDEPENDENT_AMBULATORY_CARE_PROVIDER_SITE_OTHER): Payer: BC Managed Care – PPO | Admitting: Licensed Clinical Social Worker

## 2019-03-20 ENCOUNTER — Other Ambulatory Visit: Payer: Self-pay

## 2019-03-20 DIAGNOSIS — F410 Panic disorder [episodic paroxysmal anxiety] without agoraphobia: Secondary | ICD-10-CM

## 2019-03-20 DIAGNOSIS — F411 Generalized anxiety disorder: Secondary | ICD-10-CM | POA: Diagnosis not present

## 2019-03-20 NOTE — Progress Notes (Signed)
Virtual Visit via Video Note  I connected with Troy Norman on 03/20/19 at 11:00 AM EST by a video enabled telemedicine application and verified that I am speaking with the correct person using two identifiers.     I discussed the limitations of evaluation and management by telemedicine and the availability of in person appointments. The patient expressed understanding and agreed to proceed.  Type of Therapy: Individual Therapy  Treatment Goals addressed: "reducing my anxiety".  Interventions: Motivational Interviewing, CBT, and Other: Grounding and Mindfulness techniques  Summary: Troy Norman is a 23 y.o. male who presents with Generalized Anxiety Disorder and Panic Attacks.   Suicidal/Homicidal: No without intent/plan  Therapist Response:  Jacqualine Mau) met with clinician for individual therapy. Troy Norman discussed his psychiatric symptoms and current life events. Troy Norman shared that he continues to have stress, but he has not had any panic attacks in the past week. Clinician discussed coping skills and provided feedback about deep breathing and thought stopping. Clinician identified current stressors and noted concerns about the future as a significant issue. 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 Clinician identified the importance of taking one step at a time and being able to increase his focus on the here and now.    Plan: Return again in 2-3 weeks.  Diagnosis: Axis I: Generalized Anxiety Disorder and panic attacks.   I discussed the assessment and treatment plan with the patient. The patient was provided an opportunity to ask questions and all were answered. The patient agreed with the plan and demonstrated an understanding of the instructions.   The patient was advised to call back or seek an in-person evaluation if the symptoms worsen or if the condition fails to improve as  anticipated.  I provided 45 minutes of non-face-to-face time during this encounter.   Mindi Curling, LCSW

## 2019-04-16 ENCOUNTER — Ambulatory Visit (INDEPENDENT_AMBULATORY_CARE_PROVIDER_SITE_OTHER): Payer: BC Managed Care – PPO | Admitting: Licensed Clinical Social Worker

## 2019-04-16 ENCOUNTER — Other Ambulatory Visit: Payer: Self-pay

## 2019-04-16 ENCOUNTER — Encounter (HOSPITAL_COMMUNITY): Payer: Self-pay | Admitting: Licensed Clinical Social Worker

## 2019-04-16 DIAGNOSIS — F411 Generalized anxiety disorder: Secondary | ICD-10-CM | POA: Diagnosis not present

## 2019-04-16 DIAGNOSIS — F410 Panic disorder [episodic paroxysmal anxiety] without agoraphobia: Secondary | ICD-10-CM

## 2019-04-16 NOTE — Progress Notes (Signed)
Virtual Visit via Video Note  I connected with Troy Norman on 04/16/19 at 10:00 AM EST by a video enabled telemedicine application and verified that I am speaking with the correct person using two identifiers.     I discussed the limitations of evaluation and management by telemedicine and the availability of in person appointments. The patient expressed understanding and agreed to proceed.  Type of Therapy: Individual Therapy  Treatment Goals addressed: "reducing my anxiety".  Interventions: Motivational Interviewing, CBT, and Other: Grounding and Mindfulness techniques  Summary: Troy Norman is a 23 y.o. male who presents with Generalized Anxiety Disorder and Panic Attacks.   Suicidal/Homicidal: No without intent/plan  Therapist Response:  Jacqualine Mau) met with clinician for individual therapy. Troy Norman discussed his psychiatric symptoms and current life events. Troy Norman shared that he has been concerned about a few of his classes and has some worry that not passing them would cause him to not graduate. Clinician processed these concerns, as well as utilized CBT to identify contingency plans and coping skills. Troy Norman reports he has already processed these plans and understands that if he has to take the class over again next semester, he will be okay. He reports that he has not had any panic attacks or irritability over the past several weeks and feels much more in control of himself and his mood. Clinician discussed options for discharge and noted that at this time another appointment was not needed. Troy Norman agreed to reach out in the future if he feels the need for additional support.    Plan: Return as needed.  Diagnosis: Axis I: Generalized Anxiety Disorder and panic attacks.    I discussed the assessment and treatment plan with the patient. The patient was provided an opportunity to ask questions and all were answered. The patient agreed with the plan and demonstrated an  understanding of the instructions.   The patient was advised to call back or seek an in-person evaluation if the symptoms worsen or if the condition fails to improve as anticipated.  I provided 45 minutes of non-face-to-face time during this encounter.   Mindi Curling, LCSW

## 2019-05-30 ENCOUNTER — Telehealth (INDEPENDENT_AMBULATORY_CARE_PROVIDER_SITE_OTHER): Payer: Self-pay | Admitting: Adult Health

## 2019-05-30 DIAGNOSIS — Z5329 Procedure and treatment not carried out because of patient's decision for other reasons: Secondary | ICD-10-CM

## 2019-05-30 NOTE — Progress Notes (Deleted)
Patient: Troy Norman, Male    DOB: March 22, 1996, 24 y.o.   MRN: [id number] Visit Date: 05/30/2019  Today's Provider: Jairo Ben, FNP   Chief Complaint  Patient presents with  . New Patient (Initial Visit)   Subjective:    New Patient Troy Norman is a 24 y.o. male who presents today for health maintenance and establish patient care. He feels {DESC; WELL/FAIRLY WELL/POORLY:18703}. He reports exercising ***. He reports he is sleeping {DESC; WELL/FAIRLY WELL/POORLY:18703}.  -----------------------------------------------------------------   Review of Systems  All other systems reviewed and are negative.   Social History He  reports that he has never smoked. He has never used smokeless tobacco. He reports that he does not drink alcohol or use drugs. Social History   Socioeconomic History  . Marital status: Single    Spouse name: Not on file  . Number of children: Not on file  . Years of education: Not on file  . Highest education level: Not on file  Occupational History  . Not on file  Tobacco Use  . Smoking status: Never Smoker  . Smokeless tobacco: Never Used  Substance and Sexual Activity  . Alcohol use: No  . Drug use: No  . Sexual activity: Not on file  Other Topics Concern  . Not on file  Social History Narrative  . Not on file   Social Determinants of Health   Financial Resource Strain:   . Difficulty of Paying Living Expenses: Not on file  Food Insecurity:   . Worried About Programme researcher, broadcasting/film/video in the Last Year: Not on file  . Ran Out of Food in the Last Year: Not on file  Transportation Needs:   . Lack of Transportation (Medical): Not on file  . Lack of Transportation (Non-Medical): Not on file  Physical Activity:   . Days of Exercise per Week: Not on file  . Minutes of Exercise per Session: Not on file  Stress:   . Feeling of Stress : Not on file  Social Connections:   . Frequency of Communication with Friends and  Family: Not on file  . Frequency of Social Gatherings with Friends and Family: Not on file  . Attends Religious Services: Not on file  . Active Member of Clubs or Organizations: Not on file  . Attends Banker Meetings: Not on file  . Marital Status: Not on file    There are no problems to display for this patient.   Past Surgical History:  Procedure Laterality Date  . CARDIAC SURGERY     x3 last at age 12    Family History  No family status information on file.   His family history is not on file.     No Known Allergies  Previous Medications   No medications on file    Patient Care Team: Patient, No Pcp Per as PCP - General (General Practice)      Objective:   Vitals: There were no vitals taken for this visit.   Physical Exam   Depression Screen No flowsheet data found.    Assessment & Plan:     Routine Health Maintenance and Physical Exam  Exercise Activities and Dietary recommendations Goals   None      There is no immunization history on file for this patient.  Health Maintenance  Topic Date Due  . HIV Screening  04/18/2011  . TETANUS/TDAP  04/18/2015  . INFLUENZA VACCINE  12/14/2018     Discussed  health benefits of physical activity, and encouraged him to engage in regular exercise appropriate for his age and condition.    --------------------------------------------------------------------

## 2019-06-01 NOTE — Progress Notes (Signed)
No show - not seen

## 2019-06-18 ENCOUNTER — Encounter: Payer: Self-pay | Admitting: Adult Health

## 2019-06-18 ENCOUNTER — Other Ambulatory Visit: Payer: Self-pay

## 2019-06-18 ENCOUNTER — Ambulatory Visit (INDEPENDENT_AMBULATORY_CARE_PROVIDER_SITE_OTHER): Payer: Self-pay | Admitting: Adult Health

## 2019-06-18 VITALS — BP 142/80 | HR 64 | Temp 97.3°F | Resp 16 | Ht 66.0 in | Wt 214.6 lb

## 2019-06-18 DIAGNOSIS — Q200 Common arterial trunk: Secondary | ICD-10-CM

## 2019-06-18 DIAGNOSIS — Z7689 Persons encountering health services in other specified circumstances: Secondary | ICD-10-CM | POA: Insufficient documentation

## 2019-06-18 DIAGNOSIS — Z1329 Encounter for screening for other suspected endocrine disorder: Secondary | ICD-10-CM

## 2019-06-18 DIAGNOSIS — Z13220 Encounter for screening for lipoid disorders: Secondary | ICD-10-CM

## 2019-06-18 DIAGNOSIS — Z0000 Encounter for general adult medical examination without abnormal findings: Secondary | ICD-10-CM

## 2019-06-18 DIAGNOSIS — Z9889 Other specified postprocedural states: Secondary | ICD-10-CM

## 2019-06-18 DIAGNOSIS — Z23 Encounter for immunization: Secondary | ICD-10-CM

## 2019-06-18 NOTE — Progress Notes (Addendum)
Patient: Troy Norman, Male    DOB: January 25, 1996, 24 y.o.   MRN: [id number] Visit Date: 06/18/2019  Today's Provider: Jairo Ben, FNP   Chief Complaint  Patient presents with  . New Patient (Initial Visit)   Subjective:    New Patient Troy Norman is a 24 y.o. male who presents today for health maintenance and establish care. He feels fairly well. He reports he is not exercising . He reports he is sleeping poorly, patient reports difficulty falling and staying asleep.   Truncus arteriosis - Cardiac congenital. Dr. Jefferey Pica at [facility]. He reports he sees him every two years. He has had three open heart surgeries. Last at age 20. He denies any new symptoms since last seeing cardiology. He reports he is up to date on all immunizations, he is unsure with tetanus.   Denies any suicidal or homicidal ideations. Depression medications made him worse so he stopped. He use to do therapy for anxiety but not anymore.  Denies any daily medication. 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  Denies any issues with lifting. Denies back or abdominal pain.   Patient  denies any fever, body aches,chills, rash, chest pain, shortness of breath, nausea, vomiting, or diarrhea.   -----------------------------------------------------------------   Review of Systems  Constitutional: Negative.   HENT: Negative.   Respiratory: Negative.   Cardiovascular: Negative.        History of heart surgery- see surgical history.   Gastrointestinal: Negative.   Musculoskeletal: Negative.   Skin: Negative.   Neurological: Negative.   Hematological: Negative.   Psychiatric/Behavioral: Negative.     Social History He  reports that he has never smoked. He has never used smokeless tobacco. He reports that he does not drink alcohol or use drugs.   Patient Active Problem List   Diagnosis Date Noted  . Routine health maintenance 06/18/2019  . Need  for lipid screening 06/18/2019  . Truncus arteriosus 07/01/2015  . Asthma 07/17/2011  . Chronic rhinitis 07/17/2011  . 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 Gastroesophageal reflux 07/17/2011  . S/P repair of truncus arteriosus 07/17/2011  . Weight gain 07/17/2011  ;  Patient Active Problem List   Diagnosis Date Noted  . Routine health maintenance 06/18/2019  . Need for lipid screening 06/18/2019  . Truncus arteriosus 07/01/2015  . Asthma 07/17/2011  . Chronic rhinitis 07/17/2011  . 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 Gastroesophageal reflux 07/17/2011  . S/P repair of truncus arteriosus 07/17/2011  . Weight gain 07/17/2011    Past Surgical History:  Procedure Laterality Date  . CARDIAC SURGERY     x3 last at age 52    Family History  No family status information on file.   His family history is not on file.     No Known Allergies  Previous Medications   No medications on file    Patient Care Team: Patient, No Pcp Per as PCP - General (General Practice)      Objective:  Blood pressure (!) 142/80, pulse 64, temperature (!) 97.3 F (36.3 C), temperature source Temporal, resp. rate 16, height 5\' 6"  (1.676 m), weight 214 lb 9.6 oz (97.3 kg), SpO2 98 %.  Physical Exam Constitutional:      Appearance: Normal appearance.  HENT:     Head: Normocephalic and atraumatic.     Right Ear: Tympanic membrane, ear canal and external ear normal.     Left Ear: Tympanic membrane, ear canal  and external ear normal. There is no impacted cerumen.     Nose: Nose normal.     Mouth/Throat:     Mouth: Mucous membranes are moist.     Pharynx: No oropharyngeal exudate or posterior oropharyngeal erythema.  Eyes:     General: No scleral icterus.       Right eye: No discharge.        Left eye: No discharge.     Extraocular Movements: Extraocular movements intact.     Conjunctiva/sclera: Conjunctivae normal.     Pupils: Pupils are equal, round, and reactive to light.  Cardiovascular:      Rate and Rhythm: Normal rate and regular rhythm.     Pulses: Normal pulses.     Heart sounds: Murmur present. No friction rub. No gallop.   Musculoskeletal:        General: Normal range of motion.     Cervical back: Normal range of motion and neck supple.  Skin:    General: Skin is warm and dry.     Capillary Refill: Capillary refill takes less than 2 seconds.  Neurological:     General: No focal deficit present.     Mental Status: He is alert and oriented to person, place, and time.     Cranial Nerves: No cranial nerve deficit.     Sensory: No sensory deficit.     Coordination: Coordination normal.     Gait: Gait normal.     Deep Tendon Reflexes: Reflexes normal.      Depression Screen Depression screen PHQ 2/9 06/18/2019  Decreased Interest 1  Down, Depressed, Hopeless 1  PHQ - 2 Score 2  Altered sleeping 1  Tired, decreased energy 1  Change in appetite 1  Feeling bad or failure about yourself  0  Trouble concentrating 0  Moving slowly or fidgety/restless 0  Suicidal thoughts 0  PHQ-9 Score 5  Difficult doing work/chores Somewhat difficult      Assessment & Plan:     Routine Health Maintenance and Physical Exam  Exercise Activities and Dietary recommendations Goals   None     There is no immunization history for the selected administration types on file for this patient.  Health Maintenance  Topic Date Due  . HIV Screening  04/18/2011  . TETANUS/TDAP  04/18/2015  . INFLUENZA VACCINE  12/14/2018     Discussed health benefits of physical activity, and encouraged him to engage in regular exercise appropriate for his age and condition.   yearly dental and vision exams recommended.   Routine health maintenance - Plan: CBC with Differential/Platelet, Comprehensive Metabolic Panel (CMET), TSH, Lipid Panel w/o Chol/HDL Ratio, Visual acuity screening  Need for lipid screening - Plan: CBC with Differential/Platelet, Comprehensive Metabolic Panel (CMET),  TSH, Lipid Panel w/o Chol/HDL Ratio  Screening for thyroid disorder - Plan: CBC with Differential/Platelet, Comprehensive Metabolic Panel (CMET), TSH, Lipid Panel w/o Chol/HDL Ratio  History of cardiovascular surgery  Truncus arteriosus- history  fasting labs.  Keep regular follow ups with cardiology and as needed/ advised. . 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   Return in about 6 months (around 12/16/2019), or if symptoms worsen or fail to improve, for at any time for any worsening symptoms, Go to Emergency room/ urgent care if worse.  Advised patient call the office or your primary care doctor for an appointment if no improvement within 72 hours or if any symptoms change or  worsen at any time  Advised ER or urgent Care if after hours or on weekend. Call 911 for emergency symptoms at any time.Patinet verbalized understanding of all instructions given/reviewed and treatment plan and has no further questions or concerns at this time.    Patient verbalized understanding of all instructions given and denies any further questions at this time.   --------------------------------------------------------------------

## 2019-06-18 NOTE — Patient Instructions (Signed)
Testicular Self-Exam A self-exam of your testicles (testicular self-exam) is looking at and feeling your testicles for unusual lumps or swelling. Swelling, lumps, or pain can be caused by:  Injuries.  Puffiness, redness, and soreness (inflammation).  Infection.  Extra fluids around your testicle (hydrocele).  Twisted testicles (testicular torsion).  Cancer of the testicle (testicular cancer). Why is it important to do a self-exam of testicles? You may need to do self-exams if you are at risk for cancer of the testicles. You may be at risk if you have:  A testicle that has not descended (cryptorchidism).  A history of cancer of the testicle.  A family history of cancer of the testicle. How to do a self-exam of testicles It is easiest to do a self-exam after a warm bath or shower. Testicles are harder to examine when you are cold. A normal testicle is egg-shaped and feels firm. It is smooth, and it is not tender. At the back of your testicles, there is a firm cord that feels like spaghetti (spermatic cord). Look and feel for changes  Stand and hold your penis away from your body.  Look at each testicle to check for lumps or swelling.  Roll each testicle between your thumb and finger. Feel the whole testicle. Feel for: ? Lumps. ? Swelling. ? Discomfort.  Check for swelling or tender bumps in the groin area. Your groin is where your lower belly (abdomen) meets your upper thighs. Contact a health care provider if:  You find a bump or lump. This may be like a small, hard bump that is the size of a pea.  You find swelling.  You find pain.  You find soreness.  You see or feel any other changes. Summary  A self-exam of your testicles is looking at and feeling your testicles for lumps or swelling.  You may need to do self-exams if you are at risk for cancer of the testicle.  You should check each of your testicles for lumps, swelling, or discomfort.  You should check for  swelling or tender bumps in the groin area. Your groin is where your lower belly (abdomen) meets your upper thighs. This information is not intended to replace advice given to you by your health care provider. Make sure you discuss any questions you have with your health care provider. Document Revised: 08/22/2018 Document Reviewed: 03/27/2016 Elsevier Patient Education  2020 [place] Maintenance, Male Adopting a healthy lifestyle and getting preventive care are important in promoting health and wellness. Ask your health care provider about:  The right schedule for you to have regular tests and exams.  Things you can do on your own to prevent diseases and keep yourself healthy. What should I know about diet, weight, and exercise? Eat a healthy diet   Eat a diet that includes plenty of vegetables, fruits, low-fat dairy products, and lean protein.  Do not eat a lot of foods that are high in solid fats, added sugars, or sodium. Maintain a healthy weight Body mass index (BMI) is a measurement that can be used to identify possible weight problems. It estimates body fat based on height and weight. Your health care provider can help determine your BMI and help you achieve or maintain a healthy weight. Get regular exercise Get regular exercise. This is one of the most important things you can do for your health. Most adults should:  Exercise for at least 150 minutes each week. The exercise should increase your heart rate and make  you sweat (moderate-intensity exercise).  Do strengthening exercises at least twice a week. This is in addition to the moderate-intensity exercise.  Spend less time sitting. Even light physical activity can be beneficial. Watch cholesterol and blood lipids Have your blood tested for lipids and cholesterol at 24 years of age, then have this test every 5 years. You may need to have your cholesterol levels checked more often if:  Your lipid or cholesterol  levels are high.  You are older than 24 years of age.  You are at high risk for heart disease. What should I know about cancer screening? Many types of cancers can be detected early and may often be prevented. Depending on your health history and family history, you may need to have cancer screening at various ages. This may include screening for:  Colorectal cancer.  Prostate cancer.  Skin cancer.  Lung cancer. What should I know about heart disease, diabetes, and high blood pressure? Blood pressure and heart disease  High blood pressure causes heart disease and increases the risk of stroke. This is more likely to develop in people who have high blood pressure readings, are of African descent, or are overweight.  Talk with your health care provider about your target blood pressure readings.  Have your blood pressure checked: ? Every 3-5 years if you are 20-28 years of age. ? Every year if you are 24 years old or older.  If you are between the ages of 32 and 78 and are a current or former smoker, ask your health care provider if you should have a one-time screening for abdominal aortic aneurysm (AAA). Diabetes Have regular diabetes screenings. This checks your fasting blood sugar level. Have the screening done:  Once every three years after age 11 if you are at a normal weight and have a low risk for diabetes.  More often and at a younger age if you are overweight or have a high risk for diabetes. What should I know about preventing infection? Hepatitis B If you have a higher risk for hepatitis B, you should be screened for this virus. Talk with your health care provider to find out if you are at risk for hepatitis B infection. Hepatitis C Blood testing is recommended for:  Everyone born from [birth year] through 1965.  Anyone with known risk factors for hepatitis C. Sexually transmitted infections (STIs)  You should be screened each year for STIs, including gonorrhea and  chlamydia, if: ? You are sexually active and are younger than 24 years of age. ? You are older than 24 years of age and your health care provider tells you that you are at risk for this type of infection. ? Your sexual activity has changed since you were last screened, and you are at increased risk for chlamydia or gonorrhea. Ask your health care provider if you are at risk.  Ask your health care provider about whether you are at high risk for HIV. Your health care provider may recommend a prescription medicine to help prevent HIV infection. If you choose to take medicine to prevent HIV, you should first get tested for HIV. You should then be tested every 3 months for as long as you are taking the medicine. Follow these instructions at home: Lifestyle  Do not use any products that contain nicotine or tobacco, such as cigarettes, e-cigarettes, and chewing tobacco. If you need help quitting, ask your health care provider.  Do not use street drugs.  Do not share needles.  Ask  your health care provider for help if you need support or information about quitting drugs. Alcohol use  Do not drink alcohol if your health care provider tells you not to drink.  If you drink alcohol: ? Limit how much you have to 0-2 drinks a day. ? Be aware of how much alcohol is in your drink. In the [place] one drink equals one 12 oz bottle of beer (355 mL), one 5 oz glass of wine (148 mL), or one 1 oz glass of hard liquor (44 mL). General instructions  Schedule regular health, dental, and eye exams.  Stay current with your vaccines.  Tell your health care provider if: ? You often feel depressed. ? You have ever been abused or do not feel safe at home. Summary  Adopting a healthy lifestyle and getting preventive care are important in promoting health and wellness.  Follow your health care provider's instructions about healthy diet, exercising, and getting tested or screened for diseases.  Follow your health  care provider's instructions on monitoring your cholesterol and blood pressure. This information is not intended to replace advice given to you by your health care provider. Make sure you discuss any questions you have with your health care provider. Document Revised: 04/24/2018 Document Reviewed: 04/24/2018 Elsevier Patient Education  2020 [employer].

## 2019-07-01 ENCOUNTER — Telehealth: Payer: Self-pay

## 2019-07-01 NOTE — Progress Notes (Signed)
Patient has been advised. KW 

## 2019-07-01 NOTE — Telephone Encounter (Signed)
Spoke with patient and advised him to contact cardiologist office to have portion of cardiac to be filled out on form. KW

## 2019-07-01 NOTE — Telephone Encounter (Signed)
Copied from CRM [id number]. Topic: General - Other >> Jul 01, 2019  3:03 PM Dalphine Handing A wrote: Patient stated that he is returning [place] call and would like a callback. Please advise

## 2019-07-14 NOTE — Telephone Encounter (Signed)
Pt would like to know if the paperwork can be faxed to his Cardiologist Dr. Daryl Eastern so they may fill out their portion. Please advise.  FAX# [phone]

## 2019-07-14 NOTE — Telephone Encounter (Signed)
I tried clarifying with patient about discussion on 07/01/19 and patient states that he did not understand at time of conversation that he was suppose to take form to his cardiologist to have cardiac portion filled out. Patient asked if we had form and can send to cardiologist? I didn't see form scanned,can you review chart. KW

## 2019-07-14 NOTE — Telephone Encounter (Signed)
Did he receive the form that I filled out ?

## 2019-07-15 NOTE — Telephone Encounter (Signed)
He said yesterday he didn't have it and that he can request another form and have it faxed to our office to fill out and then we can send to his cardiologist to fill out. KW

## 2019-07-15 NOTE — Telephone Encounter (Signed)
Ok yes this will be the best, I remember filling out the form and writing defer to cardiology given his extensive cardiology history on the cardiac section only.

## 2019-07-15 NOTE — Telephone Encounter (Signed)
[id number] 

## 2019-07-16 ENCOUNTER — Other Ambulatory Visit: Payer: Self-pay | Admitting: Adult Health

## 2019-07-16 DIAGNOSIS — Z111 Encounter for screening for respiratory tuberculosis: Secondary | ICD-10-CM

## 2019-07-16 NOTE — Telephone Encounter (Signed)
Form has been filled and faxed to cardiology, patient was notified and told to have labs drawn. KW

## 2019-07-16 NOTE — Addendum Note (Signed)
Addended by: Fonda Kinder on: 07/16/2019 02:32 PM   Modules accepted: Orders

## 2019-07-17 ENCOUNTER — Other Ambulatory Visit: Payer: Self-pay | Admitting: Adult Health

## 2019-07-19 LAB — COMPREHENSIVE METABOLIC PANEL
ALT: 21 IU/L (ref 0–44)
AST: 18 IU/L (ref 0–40)
Albumin/Globulin Ratio: 1.7 (ref 1.2–2.2)
Albumin: 4.8 g/dL (ref 4.1–5.2)
Alkaline Phosphatase: 47 IU/L (ref 39–117)
BUN/Creatinine Ratio: 16 (ref 9–20)
BUN: 15 mg/dL (ref 6–20)
Bilirubin Total: 0.7 mg/dL (ref 0.0–1.2)
CO2: 24 mmol/L (ref 20–29)
Calcium: 9.7 mg/dL (ref 8.7–10.2)
Chloride: 99 mmol/L (ref 96–106)
Creatinine, Ser: 0.91 mg/dL (ref 0.76–1.27)
GFR calc Af Amer: 137 mL/min/{1.73_m2} (ref >59)
GFR calc non Af Amer: 118 mL/min/{1.73_m2} (ref >59)
Globulin, Total: 2.8 g/dL (ref 1.5–4.5)
Glucose: 67 mg/dL (ref 65–99)
Potassium: 4.2 mmol/L (ref 3.5–5.2)
Sodium: 138 mmol/L (ref 134–144)
Total Protein: 7.6 g/dL (ref 6.0–8.5)

## 2019-07-19 LAB — QUANTIFERON-TB GOLD PLUS
QuantiFERON Mitogen Value: >10 IU/mL
QuantiFERON Nil Value: 0.04 IU/mL
QuantiFERON TB1 Ag Value: 0.1 IU/mL
QuantiFERON TB2 Ag Value: 0.08 IU/mL
QuantiFERON-TB Gold Plus: NEGATIVE

## 2019-07-19 LAB — CBC WITH DIFFERENTIAL/PLATELET
Basophils Absolute: 0 10*3/uL (ref 0.0–0.2)
Basos: 1 %
EOS (ABSOLUTE): 0.2 10*3/uL (ref 0.0–0.4)
Eos: 2 %
Hematocrit: 46.3 % (ref 37.5–51.0)
Hemoglobin: 15.7 g/dL (ref 13.0–17.7)
Immature Grans (Abs): 0 10*3/uL (ref 0.0–0.1)
Immature Granulocytes: 0 %
Lymphocytes Absolute: 1.8 10*3/uL (ref 0.7–3.1)
Lymphs: 20 %
MCH: 28.7 pg (ref 26.6–33.0)
MCHC: 33.9 g/dL (ref 31.5–35.7)
MCV: 85 fL (ref 79–97)
Monocytes Absolute: 0.7 10*3/uL (ref 0.1–0.9)
Monocytes: 8 %
Neutrophils Absolute: 6.1 10*3/uL (ref 1.4–7.0)
Neutrophils: 69 %
Platelets: 310 10*3/uL (ref 150–450)
RBC: 5.47 x10E6/uL (ref 4.14–5.80)
RDW: 12.8 % (ref 11.6–15.4)
WBC: 8.8 10*3/uL (ref 3.4–10.8)

## 2019-07-19 LAB — TSH: TSH: 1.37 u[IU]/mL (ref 0.450–4.500)

## 2019-07-19 LAB — LIPID PANEL W/O CHOL/HDL RATIO
Cholesterol, Total: 194 mg/dL (ref 100–199)
HDL: 47 mg/dL (ref >39)
LDL Chol Calc (NIH): 125 mg/dL — ABNORMAL HIGH (ref 0–99)
Triglycerides: 123 mg/dL (ref 0–149)
VLDL Cholesterol Cal: 22 mg/dL (ref 5–40)

## 2019-07-20 NOTE — Progress Notes (Signed)
CBC no anemia or infection. CMP glucose 100, ok but should increase tolerable exercise and monitor glucose and carbohydrates in diet. Liver, kidney and electrolytes are within normal limits. TSH f- thyroid lab is within normal limits.  LDL bad cholesterol is elevated. LDL elevated.  Discuss lifestyle modification with patient e.g. increase exercise, fiber, fruits, vegetables, lean meat, and omega 3/fish intake and decrease saturated fat.   Quantiferon TB Gold is negative. 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

## 2019-08-27 ENCOUNTER — Ambulatory Visit (HOSPITAL_COMMUNITY): Payer: BC Managed Care – PPO | Admitting: Licensed Clinical Social Worker

## 2020-08-10 IMAGING — CR DG CHEST 2V
1 series · 2 of 2 positions shown · non-contrast
Comparison: None.

CLINICAL DATA: Chest pain. Congenital heart defect status post open
heart surgery. Former smoker.

EXAM:
CHEST - 2 VIEW

[Series 1: dg chest 2 view · 0.14mm/px · 2 of 2 slices shown]
[im 1/2]
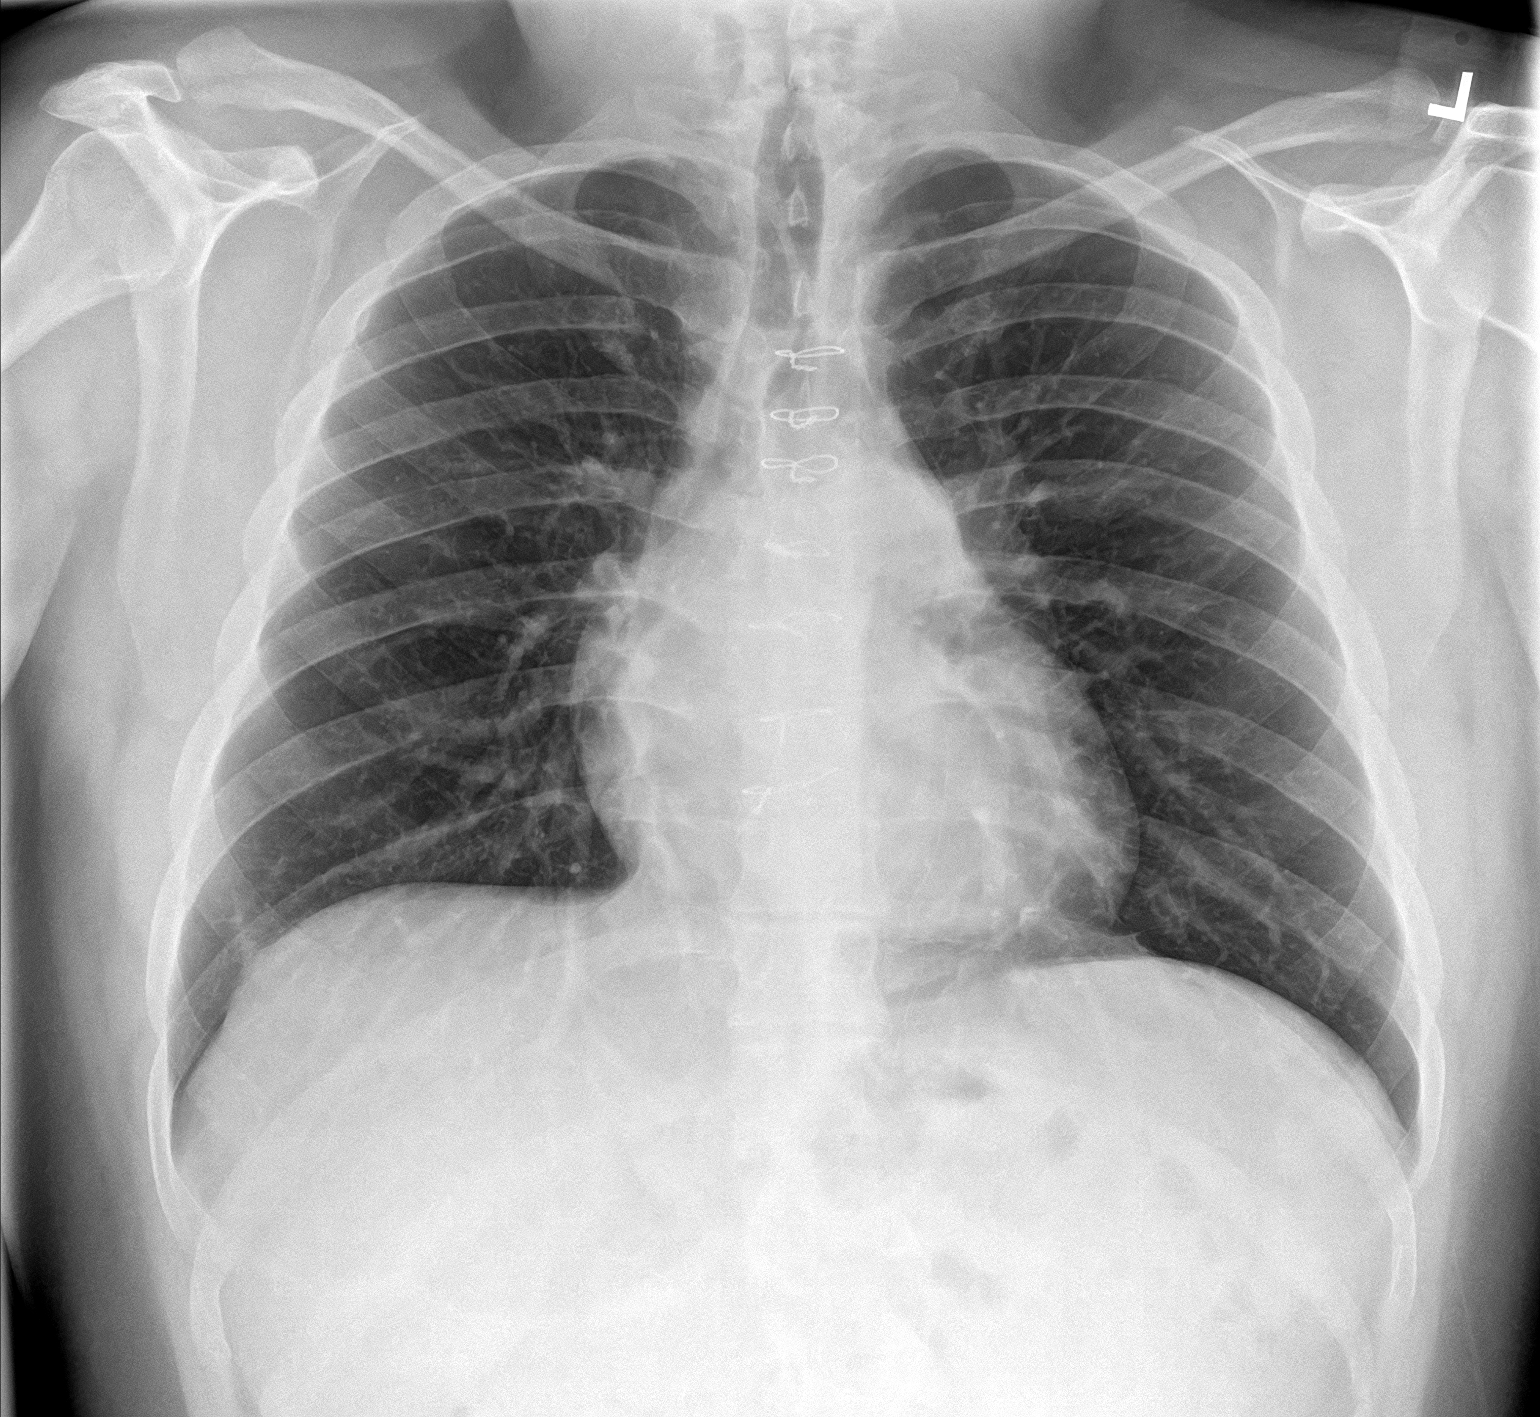
[im 2/2]
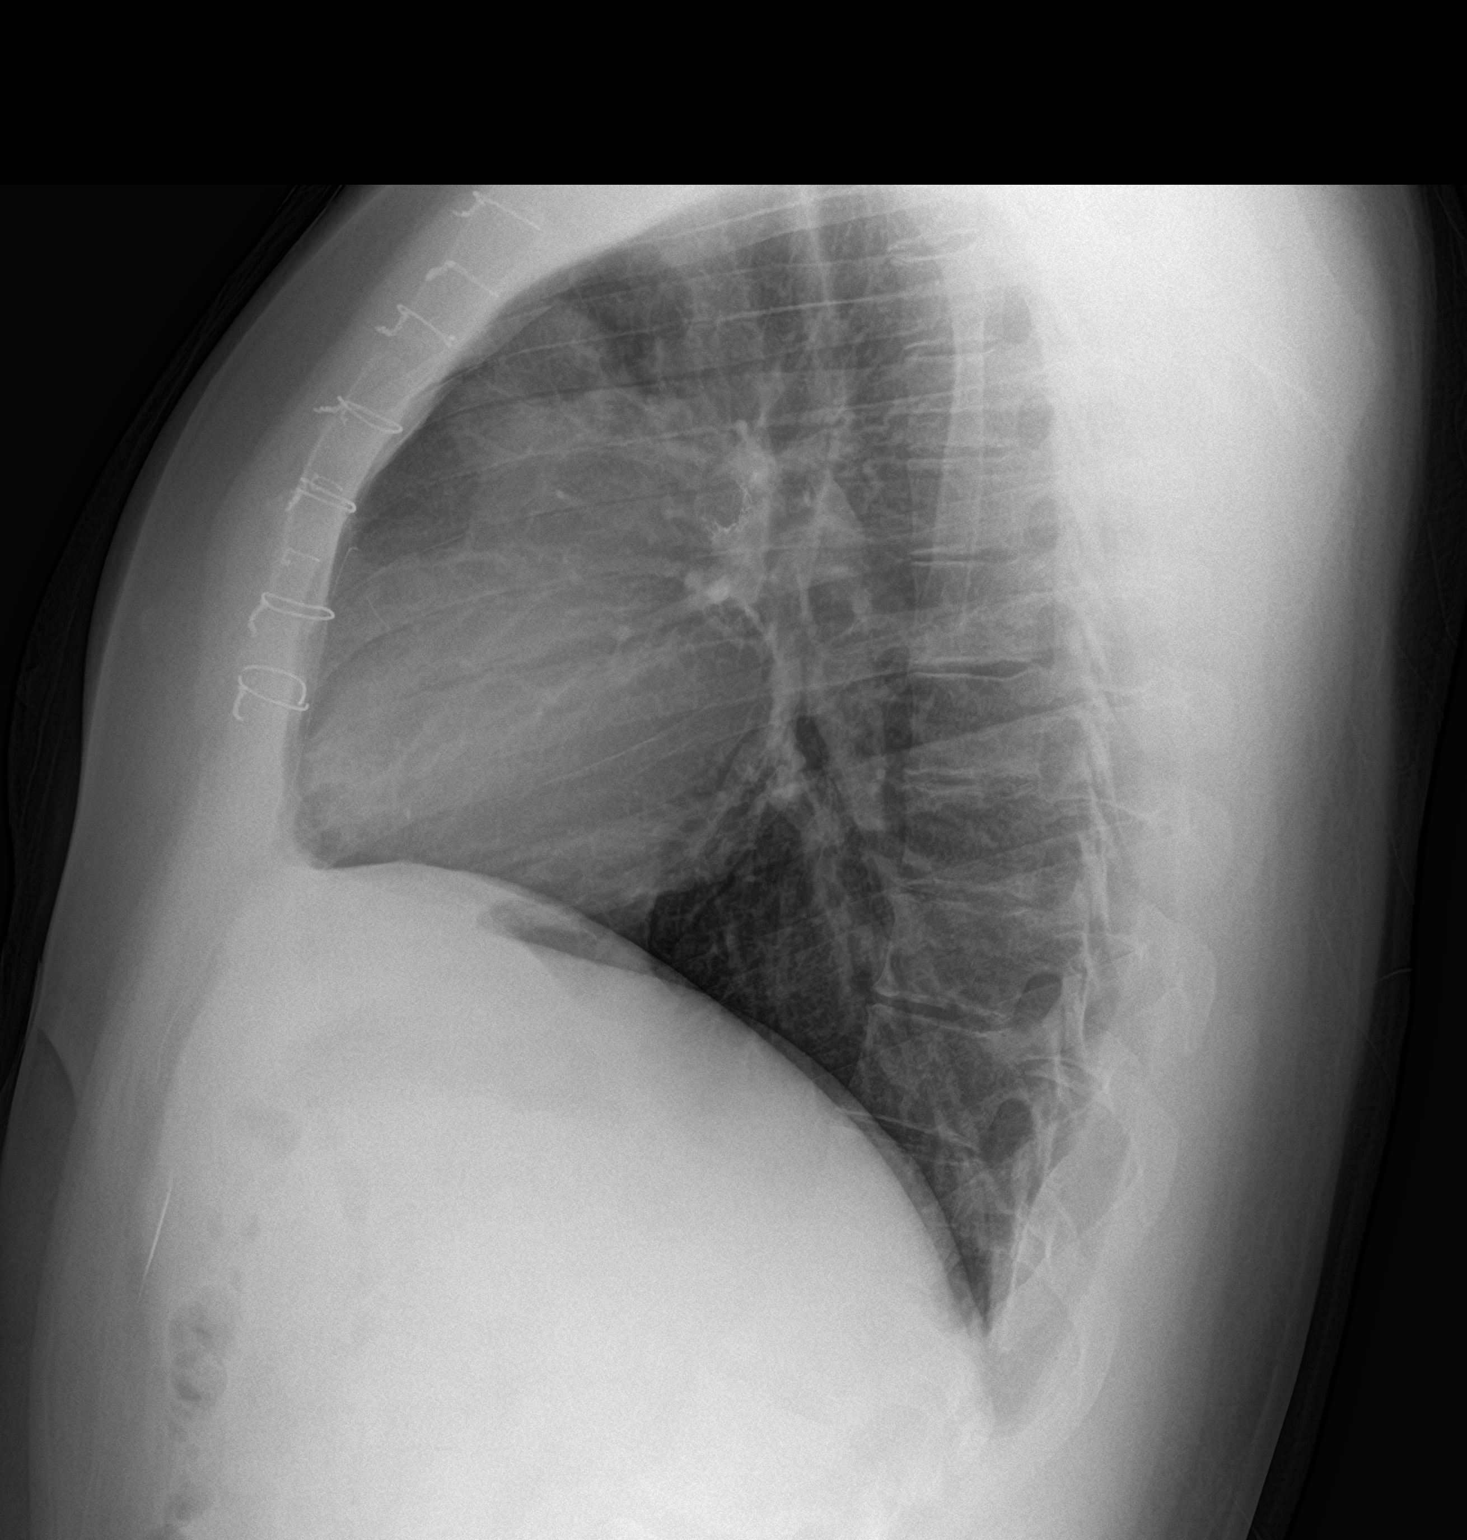

[2 of 2 positions shown; findings below may reference images not displayed]

FINDINGS: Normal mediastinal contours. Normal cardiac size. Surgical changes
status post median sternotomy. The lungs are clear. No pneumothorax
or pleural effusion. No acute finding in the visualized skeleton.
IMPRESSION: No active cardiopulmonary process.

## 2023-05-01 ENCOUNTER — Emergency Department
Admission: EM | Admit: 2023-05-01 | Discharge: 2023-05-01 | Disposition: A | Discharge status: Home / Self Care | Payer: BC Managed Care – PPO | Attending: Emergency Medicine | Admitting: Emergency Medicine

## 2023-05-01 ENCOUNTER — Emergency Department: Payer: BC Managed Care – PPO

## 2023-05-01 ENCOUNTER — Other Ambulatory Visit: Payer: Self-pay

## 2023-05-01 ENCOUNTER — Encounter: Payer: Self-pay | Admitting: Emergency Medicine

## 2023-05-01 DIAGNOSIS — J45909 Unspecified asthma, uncomplicated: Secondary | ICD-10-CM | POA: Insufficient documentation

## 2023-05-01 DIAGNOSIS — H6690 Otitis media, unspecified, unspecified ear: Secondary | ICD-10-CM

## 2023-05-01 DIAGNOSIS — H6691 Otitis media, unspecified, right ear: Secondary | ICD-10-CM | POA: Insufficient documentation

## 2023-05-01 DIAGNOSIS — Z20822 Contact with and (suspected) exposure to covid-19: Secondary | ICD-10-CM | POA: Insufficient documentation

## 2023-05-01 DIAGNOSIS — R079 Chest pain, unspecified: Secondary | ICD-10-CM | POA: Insufficient documentation

## 2023-05-01 DIAGNOSIS — H6122 Impacted cerumen, left ear: Secondary | ICD-10-CM | POA: Diagnosis not present

## 2023-05-01 LAB — BASIC METABOLIC PANEL
Anion gap: 14 (ref 5–15)
BUN: 15 mg/dL (ref 6–20)
CO2: 22 mmol/L (ref 22–32)
Calcium: 9.1 mg/dL (ref 8.9–10.3)
Chloride: 102 mmol/L (ref 98–111)
Creatinine, Ser: 0.88 mg/dL (ref 0.61–1.24)
GFR, Estimated: >60 mL/min (ref >60)
Glucose, Bld: 88 mg/dL (ref 70–99)
Potassium: 3.3 mmol/L — ABNORMAL LOW (ref 3.5–5.1)
Sodium: 138 mmol/L (ref 135–145)

## 2023-05-01 LAB — CBC WITH DIFFERENTIAL/PLATELET
Abs Immature Granulocytes: 0.05 10*3/uL (ref 0.00–0.07)
Basophils Absolute: 0.1 10*3/uL (ref 0.0–0.1)
Basophils Relative: 1 %
Eosinophils Absolute: 0.1 10*3/uL (ref 0.0–0.5)
Eosinophils Relative: 1 %
HCT: 43.6 % (ref 39.0–52.0)
Hemoglobin: 15.2 g/dL (ref 13.0–17.0)
Immature Granulocytes: 0 %
Lymphocytes Relative: 14 %
Lymphs Abs: 1.7 10*3/uL (ref 0.7–4.0)
MCH: 28.4 pg (ref 26.0–34.0)
MCHC: 34.9 g/dL (ref 30.0–36.0)
MCV: 81.3 fL (ref 80.0–100.0)
Monocytes Absolute: 1.1 10*3/uL — ABNORMAL HIGH (ref 0.1–1.0)
Monocytes Relative: 9 %
Neutro Abs: 9.3 10*3/uL — ABNORMAL HIGH (ref 1.7–7.7)
Neutrophils Relative %: 75 %
Platelets: 324 10*3/uL (ref 150–400)
RBC: 5.36 MIL/uL (ref 4.22–5.81)
RDW: 13.2 % (ref 11.5–15.5)
WBC: 12.2 10*3/uL — ABNORMAL HIGH (ref 4.0–10.5)
nRBC: 0 % (ref 0.0–0.2)

## 2023-05-01 LAB — RESP PANEL BY RT-PCR (RSV, FLU A&B, COVID)  RVPGX2
Influenza A by PCR: NEGATIVE
Influenza B by PCR: NEGATIVE
Resp Syncytial Virus by PCR: NEGATIVE
SARS Coronavirus 2 by RT PCR: NEGATIVE

## 2023-05-01 LAB — TROPONIN I (HIGH SENSITIVITY)
Troponin I (High Sensitivity): 5 ng/L (ref <18)
Troponin I (High Sensitivity): 5 ng/L (ref <18)

## 2023-05-01 MED ORDER — AMOXICILLIN 875 MG PO TABS: 875.0000 mg | ORAL_TABLET | Freq: Two times a day (BID) | ORAL | 0 refills | Status: DC

## 2023-05-01 NOTE — ED Triage Notes (Signed)
Patient to ED via POV for SOB/CP that started today. States he has had a cough x1 month. Took some mucinex today and a few hours later the SOB and CP started suddenly.  States his right ear is also bothering him and feel like he has difficultly hearing out of ear.

## 2023-05-01 NOTE — Discharge Instructions (Addendum)
You were seen in the ED today and evaluated for shortness of breath, chest pain and ear pain.  Your blood work and chest x-ray was normal today.  I do believe your ear pain is being caused by an ear infection so this will be treated with antibiotics. This medication will also treat a sinus infection and should help with your congestion. Please continue to take the mucinex as needed.  You can have Tylenol and ibuprofen as needed for pain.  Please follow-up with your preferred cardiologist.  Schedule an appointment with your primary care provider if you are not seeing an improvement in your symptoms in a few days.  You can return to the ED with any new or worsening symptoms.

## 2023-05-01 NOTE — ED Provider Notes (Addendum)
[facility] Provider Note    Event Date/Time   First MD Initiated Contact with Patient 05/01/23 2105     (approximate)   History   Chest Pain   HPI  Troy Norman is a 27 y.o. male with PMH of truncus arteriosus, heart murmur, anxiety, depression and asthma presents for evaluation of sudden onset shortness of breath and chest pain that began this afternoon.  Patient reports he was at work when it began.  He says he has had some cough and congestion for about a month.  He has been taking Mucinex to treat his symptoms at home.  He also reports right ear pain and says he has had difficulty hearing out of that ear.      Physical Exam   Triage Vital Signs: ED Triage Vitals [05/01/23 1645]  Encounter Vitals Group     BP (!) 165/96     Systolic BP Percentile      Diastolic BP Percentile      Pulse Rate 95     Resp 18     Temp 99.2 F (37.3 C)     Temp Source Oral     SpO2 98 %     Weight 225 lb (102.1 kg)     Height 5\' 6"  (1.676 m)     Head Circumference      Peak Flow      Pain Score 5     Pain Loc      Pain Education      Exclude from Growth Chart     Most recent vital signs: Vitals:   05/01/23 1645 05/01/23 2142  BP: (!) 165/96 (!) 158/90  Pulse: 95 88  Resp: 18 17  Temp: 99.2 F (37.3 C) 98.7 F (37.1 C)  SpO2: 98% 99%   General: Awake, no distress.  CV:  Good peripheral perfusion.  RRR, murmur present. Resp:  Normal effort.  CTAB. Abd:  No distention.  Other:  Cerumen impaction in the left ear.  Right EAC is clear, about half of the right TM is erythematous with the other half being translucent,   ED Results / Procedures / Treatments   Labs (all labs ordered are listed, but only abnormal results are displayed) Labs Reviewed  BASIC METABOLIC PANEL - Abnormal; Notable for the following components:      Result Value   Potassium 3.3 (*)    All other components within normal limits  CBC WITH DIFFERENTIAL/PLATELET -  Abnormal; Notable for the following components:   WBC 12.2 (*)    Neutro Abs 9.3 (*)    Monocytes Absolute 1.1 (*)    All other components within normal limits  RESP PANEL BY RT-PCR (RSV, FLU A&B, COVID)  RVPGX2  TROPONIN I (HIGH SENSITIVITY)  TROPONIN I (HIGH SENSITIVITY)     EKG  ED provider interpretation: Normal sinus rhythm with right bundle branch block.  Vent. rate 94 BPM PR interval 122 ms QRS duration 144 ms QT/QTcB 394/492 ms P-R-T axes 3 119 65  RADIOLOGY  Chest x-ray obtained, interpreted the images as well as reviewed the radiologist report which was negative for any acute abnormalities.  PROCEDURES:  Critical Care performed: No  Procedures   MEDICATIONS ORDERED IN ED: Medications - No data to display   IMPRESSION / MDM / ASSESSMENT AND PLAN / ED COURSE  I reviewed the triage vital signs and the nursing notes.  27 year old male presents for evaluation of shortness of breath and chest pain.  Patient was hypertensive in triage otherwise vital signs are stable.  Patient NAD on exam.  Differential diagnosis includes, but is not limited to, ACS, aortic dissection, pulmonary embolism, cardiac tamponade, pneumothorax, pneumonia, pericarditis, myocarditis, GI-related causes including esophagitis/gastritis, and musculoskeletal chest wall pain.    Patient's presentation is most consistent with acute presentation with potential threat to life or bodily function.  Respiratory panel was negative for flu, COVID and RSV.  Troponin was negative.  BMP and CBC unremarkable aside from mildly elevated WBCs.  Chest x-ray obtained which was negative.  EKG shows right bundle branch block.  Patient is PERC negative.   I discussed the right bundle branch block with the patient and his mother.  I recommend follow-up with cardiology.  They have a specific cardiologist that they were recommended to see due to patient's cardiac history and would prefer  to schedule an appointment with him so I will not place a referral today.  On reassessment patient reported his chest pain to have improved.  Patient's workup is reassuring, with his history of congestion, ear pain and decreased hearing out of the right ear as well as some erythema seen on exam, I will treat patient for an ear infection.  They were agreeable to plan, voiced understanding and he was stable at discharge.  FINAL CLINICAL IMPRESSION(S) / ED DIAGNOSES   Final diagnoses:  Chest pain, unspecified type  Acute otitis media, unspecified otitis media type     Rx / DC Orders   ED Discharge Orders          Ordered    amoxicillin (AMOXIL) 875 MG tablet  2 times daily        05/01/23 2143             Note:  This document was prepared using Dragon voice recognition software and may include unintentional dictation errors.   Cameron Ali, PA-C 05/01/23 2144    Cameron Ali, PA-C 05/01/23 2146    Willy Eddy, MD 05/01/23 2242

## 2023-05-01 NOTE — ED Provider Triage Note (Signed)
Emergency Medicine Provider Triage Evaluation Note  Troy Norman , a 27 y.o. male  was evaluated in triage.  Pt complains of difficulty breathing and chest pain. He has had some congestion with mucus build up for a while has been taking mucinex. Felt fine until this afternoon when "everything ramped up".  Review of Systems  Positive: Cough, chest pain, SOB Negative: fevers  Physical Exam  There were no vitals taken for this visit. Gen:   Awake, no distress   Resp:  Normal effort  MSK:   Moves extremities without difficulty  Other:    Medical Decision Making  Medically screening exam initiated at 4:39 PM.  Appropriate orders placed.  Troy Norman was informed that the remainder of the evaluation will be completed by another provider, this initial triage assessment does not replace that evaluation, and the importance of remaining in the ED until their evaluation is complete.     Troy Ali, PA-C 05/01/23 1641

## 2023-05-07 ENCOUNTER — Ambulatory Visit
Admission: EM | Admit: 2023-05-07 | Discharge: 2023-05-07 | Disposition: A | Discharge status: Home / Self Care | Payer: BC Managed Care – PPO | Attending: Emergency Medicine | Admitting: Emergency Medicine

## 2023-05-07 DIAGNOSIS — H6691 Otitis media, unspecified, right ear: Secondary | ICD-10-CM | POA: Diagnosis not present

## 2023-05-07 MED ORDER — CEFDINIR 300 MG PO CAPS: 300.0000 mg | ORAL_CAPSULE | Freq: Two times a day (BID) | ORAL | 0 refills | Status: AC

## 2023-05-07 NOTE — ED Triage Notes (Signed)
Patient to Urgent Care with complaints of right sided ear pain/ pressure.  Symptoms started 1 week ago. Currently taking abx but felt like his ear "popped" and the pressure increased. Reports intermittent r sided ear pain x1 month.  Meds: amoxicillin.

## 2023-05-07 NOTE — ED Provider Notes (Signed)
Troy Norman    CSN: [id number] Arrival date & time: 05/07/23  1007      History   Chief Complaint Chief Complaint  Patient presents with   Otalgia    HPI Troy Norman is a 27 y.o. male.  Patient presents with right ear pain x 1 week.  He is currently on amoxicillin but is not improving.  He denies fever, ear drainage, sore throat, cough, shortness of breath.  Patient was seen at [facility] ED on 05/01/2023; diagnosed with chest pain and acute otitis media; treated with amoxicillin.  The history is provided by the patient and medical records.    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 Authorizing Provider  cefdinir (OMNICEF) 300 MG capsule Take 1 capsule (300 mg total) by mouth 2 (two) times daily for 7 days. 05/07/23 05/14/23 Yes Mickie Bail, NP    Family History History reviewed. No pertinent family history.  Social History Social History   Tobacco Use   Smoking status: Never   Smokeless tobacco: Never  Substance Use Topics   Alcohol use: No   Drug use: No     Allergies   Patient has no known allergies.   Review of Systems Review of Systems  Constitutional:  Negative for chills and fever.  HENT:  Positive for ear pain. Negative for ear discharge and sore throat.   Respiratory:  Negative for cough and  shortness of breath.      Physical Exam Triage Vital Signs ED Triage Vitals  Encounter Vitals Group     BP 05/07/23 1115 125/79     Systolic BP Percentile --      Diastolic BP Percentile --      Pulse Rate 05/07/23 1105 (!) 102     Resp 05/07/23 1105 18     Temp 05/07/23 1105 98.8 F (37.1 C)     Temp src --      SpO2 05/07/23 1105 98 %     Weight --      Height --      Head Circumference --      Peak Flow --      Pain Score 05/07/23 1115 2     Pain Loc --      Pain Education --      Exclude from Growth Chart --    No data found.  Updated Vital Signs BP 125/79   Pulse (!) 102   Temp 98.8 F (37.1 C)   Resp 18   SpO2 98%   Visual Acuity Right Eye Distance:   Left Eye Distance:   Bilateral Distance:    Right Eye Near:   Left Eye Near:    Bilateral Near:     Physical Exam Constitutional:  General: He is not in acute distress. HENT:     Right Ear: Tympanic membrane is erythematous.     Left Ear: Tympanic membrane normal.     Nose: Nose normal.     Mouth/Throat:     Mouth: Mucous membranes are moist.     Pharynx: Oropharynx is clear.  Cardiovascular:     Rate and Rhythm: Normal rate and regular rhythm.     Heart sounds: Normal heart sounds.  Pulmonary:     Effort: Pulmonary effort is normal. No respiratory distress.     Breath sounds: Normal breath sounds. No wheezing.  Skin:    General: Skin is warm and dry.  Neurological:     Mental Status: He is alert.      UC Treatments / Results  Labs (all labs ordered are listed, but only abnormal results are displayed) Labs Reviewed - No data to display  EKG   Radiology No results found.  Procedures Procedures (including critical care time)  Medications Ordered in UC Medications - No data to display  Initial Impression / Assessment and Plan / UC Course  I have reviewed the triage vital signs and the nursing notes.  Pertinent labs & imaging results that were available during my care of the  patient were reviewed by me and considered in my medical decision making (see chart for details).    Right otitis media.  Patient is currently on day 7 of amoxicillin and is not improving.  Discontinuing this and starting cefdinir.  Tylenol as needed.  Patient does not currently have a PCP but declines assistance with scheduling an appointment.  He plans to establish with the same PCP his parents use and will call to schedule an appointment.  Education provided on otitis media.  He agrees to plan of care.  Final Clinical Impressions(s) / UC Diagnoses   Final diagnoses:  Right otitis media, unspecified otitis media type     Discharge Instructions      Stop the amoxicillin and start the cefdinir.  Establish a primary care provider.     ED Prescriptions     Medication Sig Dispense Auth. Provider   cefdinir (OMNICEF) 300 MG capsule Take 1 capsule (300 mg total) by mouth 2 (two) times daily for 7 days. 14 capsule Mickie Bail, NP      PDMP not reviewed this encounter.   Mickie Bail, NP 05/07/23 [phone]

## 2023-05-07 NOTE — Discharge Instructions (Addendum)
Stop the amoxicillin and start the cefdinir.  Establish a primary care provider.

## 2023-06-18 ENCOUNTER — Ambulatory Visit: Payer: Self-pay | Admitting: Family Medicine

## 2023-07-13 ENCOUNTER — Ambulatory Visit: Payer: 59 | Admitting: Family Medicine

## 2023-07-13 ENCOUNTER — Encounter: Payer: Self-pay | Admitting: Family Medicine

## 2023-07-13 VITALS — BP 149/67 | HR 79 | Resp 16 | Ht 66.0 in | Wt 257.6 lb

## 2023-07-13 DIAGNOSIS — R030 Elevated blood-pressure reading, without diagnosis of hypertension: Secondary | ICD-10-CM | POA: Diagnosis not present

## 2023-07-13 DIAGNOSIS — R011 Cardiac murmur, unspecified: Secondary | ICD-10-CM

## 2023-07-13 DIAGNOSIS — Z0001 Encounter for general adult medical examination with abnormal findings: Secondary | ICD-10-CM

## 2023-07-13 DIAGNOSIS — J4520 Mild intermittent asthma, uncomplicated: Secondary | ICD-10-CM | POA: Diagnosis not present

## 2023-07-13 DIAGNOSIS — Z9889 Other specified postprocedural states: Secondary | ICD-10-CM

## 2023-07-13 DIAGNOSIS — Z0000 Encounter for general adult medical examination without abnormal findings: Secondary | ICD-10-CM

## 2023-07-13 NOTE — Assessment & Plan Note (Signed)
 Patient denies any concerns regarding his history of asthma. Presence of asthma qualifies him for the pneumonia vaccine. - Declined pneumonia vaccine

## 2023-07-13 NOTE — Patient Instructions (Signed)

## 2023-07-13 NOTE — Assessment & Plan Note (Deleted)
 Physical exam overall unremarkable except as noted above.  Establishing care. Received flu vaccine this year but no documentation provided. Eligible for pneumonia vaccine due to asthma. Last cholesterol panel in 2021. No current symptoms of pain, shortness of breath, headaches, vision changes, nausea, vomiting, fever, chills, or fatigue. No blood in stool or urine, no pain or burning with urination, no increased eating, drinking, or urination, no lightheadedness or dizziness. No recent eye exam. - Mark flu vaccine as declined until documentation is provided - Declined pneumonia vaccine - Patient declined baseline blood work including cholesterol panel - Recommend regular eye exams every 1-2 years

## 2023-07-13 NOTE — Progress Notes (Signed)
 Established patient visit   Patient: Troy Norman   DOB: 01/20/1996   28 y.o. Male  MRN: [id number] Visit Date: 07/13/2023  Today's healthcare provider: Sherlyn Hay, DO   Chief Complaint  Patient presents with   Establish Care   Subjective    HPI Zenon Leaf is a 28 year old male with history of truncus arteriosus status post repair, heart murmur, anxiety, depression and asthma, who presents to establish care.  He has no specific concerns at this time and wants to ensure he has a healthcare provider for future needs.   He has a history of hypertension, which he attributes to past open heart surgeries, with the last surgery occurring 12 years ago. He has not been on any medication for hypertension and has not followed up with cardiology since stopping regular physician visits several years ago. He describes his blood pressure as 'a little high' most of his life, with recent stress potentially contributing to higher readings. His blood pressure sometimes reads in the 130s/60s range. He has not been checking his blood pressure at home.  He has a history of asthma, which makes him eligible for the pneumonia vaccine. He has not received the pneumonia vaccine yet. There is no indication of current asthma exacerbation or symptoms during the visit.  In terms of lifestyle, he tries to exercise on weekends, such as by going hiking, and describes his diet as 'pretty okay,' including fruits and occasional fast food. He has not had an eye exam recently and acknowledges the need to schedule one. He had an ear infection two months ago, which has since cleared, though there is some residual effusion. He has received the flu vaccine this year, although documentation is not available. He has not had a cholesterol panel since 2021 and declined baseline blood work at this visit.     Medications: Outpatient Medications Prior to Visit  Medication Sig   fluticasone (FLONASE) 50 MCG/ACT  nasal spray Place into both nostrils daily.   No facility-administered medications prior to visit.    Review of Systems  Constitutional:  Negative for appetite change, chills, fatigue and fever.  HENT:  Negative for congestion, ear pain, hearing loss, nosebleeds and trouble swallowing.   Eyes:  Negative for pain and visual disturbance.  Respiratory:  Negative for cough, chest tightness and shortness of breath.   Cardiovascular:  Negative for chest pain, palpitations and leg swelling.  Gastrointestinal:  Negative for abdominal pain, blood in stool, constipation, diarrhea, nausea and vomiting.  Endocrine: Negative for polydipsia, polyphagia and polyuria.  Genitourinary:  Negative for dysuria, flank pain and hematuria.  Musculoskeletal:  Negative for arthralgias, back pain, joint swelling, myalgias and neck stiffness.  Skin:  Negative for color change, rash and wound.  Neurological:  Negative for dizziness, tremors, seizures, speech difficulty, weakness, light-headedness and headaches.  Psychiatric/Behavioral:  Negative for behavioral problems, confusion, decreased concentration, dysphoric mood and sleep disturbance. The patient is not nervous/anxious.   All other systems reviewed and are negative.       Objective    BP (!) 149/67 (BP Location: Right Arm, Patient Position: Sitting, Cuff Size: Large)   Pulse 79   Resp 16   Ht 5\' 6"  (1.676 m)   Wt 257 lb 9.6 oz (116.8 kg)   SpO2 98%   BMI 41.58 kg/m     Physical Exam Vitals and nursing note reviewed.  Constitutional:      General: He is awake.  Appearance: Normal appearance. He is obese.  HENT:     Head: Normocephalic and atraumatic.     Right Ear: Tympanic membrane, ear canal and external ear normal.     Left Ear: Tympanic membrane, ear canal and external ear normal.     Nose: Nose normal.     Mouth/Throat:     Mouth: Mucous membranes are moist.     Pharynx: Oropharynx is clear. No oropharyngeal exudate or posterior  oropharyngeal erythema.  Eyes:     General: No scleral icterus.    Extraocular Movements: Extraocular movements intact.     Conjunctiva/sclera: Conjunctivae normal.     Pupils: Pupils are equal, round, and reactive to light.  Neck:     Thyroid: No thyromegaly or thyroid tenderness.  Cardiovascular:     Rate and Rhythm: Normal rate and regular rhythm.     Pulses: Normal pulses.     Heart sounds: Murmur (systolic) heard.  Pulmonary:     Effort: Pulmonary effort is normal. No tachypnea, bradypnea or respiratory distress.     Breath sounds: Normal breath sounds. No stridor. No wheezing, rhonchi or rales.  Abdominal:     General: Bowel sounds are normal. There is no distension.     Palpations: Abdomen is soft. There is no mass.     Tenderness: There is no abdominal tenderness. There is no guarding.     Hernia: No hernia is present.  Musculoskeletal:     Cervical back: Normal range of motion and neck supple.     Right lower leg: No edema.     Left lower leg: No edema.  Lymphadenopathy:     Cervical: No cervical adenopathy.  Skin:    General: Skin is warm and dry.  Neurological:     Mental Status: He is alert and oriented to person, place, and time. Mental status is at baseline.  Psychiatric:        Mood and Affect: Mood normal.        Behavior: Behavior normal.      No results found for any visits on 07/13/23.  Assessment & Plan    Encounter for medical examination to establish care Assessment & Plan: Physical exam overall unremarkable except as noted above.  Establishing care. Received flu vaccine this year but no documentation provided. Eligible for pneumonia vaccine due to asthma. Last cholesterol panel in 2021. No current symptoms of pain, shortness of breath, headaches, vision changes, nausea, vomiting, fever, chills, or fatigue. No blood in stool or urine, no pain or burning with urination, no increased eating, drinking, or urination, no lightheadedness or dizziness. No  recent eye exam. - Mark flu vaccine as declined until documentation is provided - Declined pneumonia vaccine - Patient declined baseline blood work including cholesterol panel - Recommend regular eye exams every 1-2 years   Elevated blood pressure reading in office without diagnosis of hypertension Assessment & Plan: Slightly elevated blood pressure, with history of open heart surgeries.  Recent blood pressure at ER 125/79.  Recent stress may contribute to higher readings. Possible white coat hypertension. Discussed maintaining blood pressure under 130/80 mmHg and potential need for low-dose medication if readings remain high. - Obtain an upper arm blood pressure cuff - Monitor blood pressure daily or every other day and maintain a log - Bring blood pressure log and cuff to next appointment for comparison - Consider low-dose blood pressure medication if readings remain high   Mild intermittent asthma without complication Assessment & Plan: Patient denies any concerns  regarding his history of asthma. Presence of asthma qualifies him for the pneumonia vaccine. - Declined pneumonia vaccine   S/P repair of truncus arteriosus -     Ambulatory referral to Cardiology  Heart murmur -     Ambulatory referral to Cardiology  Resolved Ear Infection Ear infection two months ago. Current examination shows clear ear with residual effusion behind the tympanic membrane, expected to clear over time. Some ear wax present but not causing issues. - Monitor for symptoms or discomfort - No intervention needed for ear wax as it is soft and should clear on its own  Follow-up - Encourage follow-up with cardiology for ongoing management of hypertension and post-surgical care. Return in about 3 months (around 10/10/2023) for HTN.      I discussed the assessment and treatment plan with the patient  The patient was provided an opportunity to ask questions and all were answered. The patient agreed with the  plan and demonstrated an understanding of the instructions.   The patient was advised to call back or seek an in-person evaluation if the symptoms worsen or if the condition fails to improve as anticipated.    Sherlyn Hay, DO  [facility] [phone] (phone) [phone] (fax)  [facility]

## 2023-07-13 NOTE — Assessment & Plan Note (Signed)
 Slightly elevated blood pressure, with history of open heart surgeries.  Recent blood pressure at ER 125/79.  Recent stress may contribute to higher readings. Possible white coat hypertension. Discussed maintaining blood pressure under 130/80 mmHg and potential need for low-dose medication if readings remain high. - Obtain an upper arm blood pressure cuff - Monitor blood pressure daily or every other day and maintain a log - Bring blood pressure log and cuff to next appointment for comparison - Consider low-dose blood pressure medication if readings remain high

## 2023-07-13 NOTE — Assessment & Plan Note (Signed)
 Physical exam overall unremarkable except as noted above.  Establishing care. Received flu vaccine this year but no documentation provided. Eligible for pneumonia vaccine due to asthma. Last cholesterol panel in 2021. No current symptoms of pain, shortness of breath, headaches, vision changes, nausea, vomiting, fever, chills, or fatigue. No blood in stool or urine, no pain or burning with urination, no increased eating, drinking, or urination, no lightheadedness or dizziness. No recent eye exam. - Mark flu vaccine as declined until documentation is provided - Declined pneumonia vaccine - Patient declined baseline blood work including cholesterol panel - Recommend regular eye exams every 1-2 years

## 2023-10-22 ENCOUNTER — Ambulatory Visit: Payer: 59 | Admitting: Family Medicine

## 2023-12-25 ENCOUNTER — Encounter: Payer: Self-pay | Admitting: Emergency Medicine

## 2023-12-25 ENCOUNTER — Ambulatory Visit
Admission: EM | Admit: 2023-12-25 | Discharge: 2023-12-25 | Disposition: A | Discharge status: Home / Self Care | Attending: Emergency Medicine | Admitting: Emergency Medicine

## 2023-12-25 DIAGNOSIS — H60391 Other infective otitis externa, right ear: Secondary | ICD-10-CM

## 2023-12-25 DIAGNOSIS — H6991 Unspecified Eustachian tube disorder, right ear: Secondary | ICD-10-CM | POA: Diagnosis not present

## 2023-12-25 MED ORDER — CIPROFLOXACIN-DEXAMETHASONE 0.3-0.1 % OT SUSP: 4.0000 [drp] | Freq: Two times a day (BID) | OTIC | 0 refills | Status: AC

## 2023-12-25 NOTE — ED Triage Notes (Signed)
 Patient reports pressure  in right with drainage x 4 days. Patient has not used anything for symptoms. Patient has history of ear infections.

## 2023-12-25 NOTE — ED Provider Notes (Signed)
 Troy Norman    CSN: [id number] Arrival date & time: 12/25/23  0820      History   Chief Complaint Chief Complaint  Patient presents with   Ear Drainage    HPI Troy Norman is a 28 y.o. male.   Patient presents for evaluation of right-sided ear pain and sensation of fluid and drainage within the ear beginning 4 days ago.  Felt similar to when he had a prior ear infection, endorses that his partner looked in the ear and it appeared to be read.  Experiencing nasal congestion but endorses has improved with time.  Has attempted use of Mucinex DM.  Denies fever decreased hearing, sinus pressure.  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 Authorizing Provider  ciprofloxacin -dexamethasone  (CIPRODEX ) OTIC suspension Place 4 drops into the right ear 2 (two) times daily. 12/25/23  Yes Nafeesah Lapaglia R, NP  fluticasone (FLONASE) 50 MCG/ACT nasal spray Place into both nostrils daily.    [provider]    Family History Family History  Problem Relation Age of Onset   Diabetes Mother     Social History Social History   Tobacco Use    Smoking status: Never   Smokeless tobacco: Never  Vaping Use   Vaping status: Never Used  Substance Use Topics   Alcohol use: No   Drug use: No     Allergies   Patient has no known allergies.   Review of Systems Review of Systems   Physical Exam Triage Vital Signs ED Triage Vitals  Encounter Vitals Group     BP 12/25/23 0836 124/82     Girls Systolic BP Percentile --      Girls Diastolic BP Percentile --      Boys Systolic BP Percentile --      Boys Diastolic BP Percentile --      Pulse Rate 12/25/23 0836 82     Resp 12/25/23 0836 20     Temp 12/25/23 0836 98.1 F (36.7 C)     Temp Source 12/25/23 0836 Oral     SpO2 12/25/23 0836 97 %     Weight --      Height --      Head Circumference --      Peak Flow --      Pain Score 12/25/23 0840 0     Pain Loc --      Pain Education --      Exclude from Growth Chart --    No data found.  Updated Vital Signs BP 124/82 (BP Location:  Left Arm)   Pulse 82   Temp 98.1 F (36.7 C) (Oral)   Resp 20   SpO2 97%   Visual Acuity Right Eye Distance:   Left Eye Distance:   Bilateral Distance:    Right Eye Near:   Left Eye Near:    Bilateral Near:     Physical Exam Constitutional:      Appearance: Normal appearance.  HENT:     Left Ear: There is impacted cerumen.     Ears:     Comments: Edema present within the right ear canal, no swelling or drainage noted, middle ear effusion to the right tympanic membrane, no erythema or bulging noted Eyes:     Extraocular Movements: Extraocular movements intact.  Pulmonary:     Effort: Pulmonary effort is normal.  Neurological:     Mental Status: He is alert and oriented to person, place, and time. Mental status is at baseline.      UC Treatments / Results  Labs (all labs ordered are listed, but only abnormal results are displayed) Labs Reviewed - No data to display  EKG   Radiology No results found.  Procedures Procedures (including critical care  time)  Medications Ordered in UC Medications - No data to display  Initial Impression / Assessment and Plan / UC Course  I have reviewed the triage vital signs and the nursing notes.  Pertinent labs & imaging results that were available during my care of the patient were reviewed by me and considered in my medical decision making (see chart for details).  Infective otitis externa of the right ear, right eustachian tube dysfunction  Erythema present to the ear canal concerning for infection placed on Ciprodex , also noted fluid behind the ear, discussed most likely related to current congestion, recommended continued use of mucolytic's and decongestants for additional support, advise follow-up if no improvement seen, advised against ear cleaning Final Clinical Impressions(s) / UC Diagnoses   Final diagnoses:  Infective otitis externa of right ear  Eustachian tube dysfunction, right     Discharge Instructions      Today you are being treated for an infection of the ear canal, redness present on exam but no drainage, able to visualize fluid behind the eardrum but the eardrum does not appear to be infected  Place 4 drops of Ciprodex  which is an antibiotic.  Continue with the right ear every morning and every evening for 7 days  Continue to take Mucinex or pseudoephedrine for management of congestion as this can further worsen your symptoms  You may use Tylenol  or ibuprofen for management of discomfort  May hold warm compresses to the ear for additional comfort  Please not attempted any ear cleaning or object or fluid placement into the ear canal to prevent further irritation    ED Prescriptions     Medication Sig Dispense Auth. Provider   ciprofloxacin -dexamethasone  (CIPRODEX ) OTIC suspension Place 4 drops into the right ear 2 (two) times daily. 7.5 mL Teresa Shelba SAUNDERS, NP      PDMP not reviewed this encounter.   Teresa Shelba SAUNDERS, [place] 12/25/23 [phone]

## 2023-12-25 NOTE — Discharge Instructions (Signed)
 Today you are being treated for an infection of the ear canal, redness present on exam but no drainage, able to visualize fluid behind the eardrum but the eardrum does not appear to be infected  Place 4 drops of Ciprodex  which is an antibiotic.  Continue with the right ear every morning and every evening for 7 days  Continue to take Mucinex or pseudoephedrine for management of congestion as this can further worsen your symptoms  You may use Tylenol  or ibuprofen for management of discomfort  May hold warm compresses to the ear for additional comfort  Please not attempted any ear cleaning or object or fluid placement into the ear canal to prevent further irritation
